# Patient Record
Sex: Male | Born: 1975 | Race: White | Hispanic: No | Marital: Single | Smoking: Former smoker
Health system: Southern US, Community
[De-identification: ages and names within clinical notes are randomized; demographics above are authoritative.]

## PROBLEM LIST (undated history)

## (undated) DIAGNOSIS — G4730 Sleep apnea, unspecified: Secondary | ICD-10-CM

## (undated) DIAGNOSIS — I4891 Unspecified atrial fibrillation: Secondary | ICD-10-CM

## (undated) DIAGNOSIS — I10 Essential (primary) hypertension: Secondary | ICD-10-CM

## (undated) DIAGNOSIS — J45909 Unspecified asthma, uncomplicated: Secondary | ICD-10-CM

## (undated) DIAGNOSIS — I509 Heart failure, unspecified: Secondary | ICD-10-CM

## (undated) HISTORY — DX: Heart failure, unspecified: I50.9

## (undated) HISTORY — PX: OTHER SURGICAL HISTORY: SHX169

## (undated) HISTORY — DX: Unspecified asthma, uncomplicated: J45.909

## (undated) HISTORY — DX: Unspecified atrial fibrillation: I48.91

## (undated) HISTORY — DX: Sleep apnea, unspecified: G47.30

## (undated) HISTORY — DX: Essential (primary) hypertension: I10

---

## 2020-09-24 DIAGNOSIS — R059 Cough, unspecified: Secondary | ICD-10-CM | POA: Diagnosis not present

## 2020-09-24 DIAGNOSIS — J0100 Acute maxillary sinusitis, unspecified: Secondary | ICD-10-CM | POA: Diagnosis not present

## 2020-09-24 DIAGNOSIS — R509 Fever, unspecified: Secondary | ICD-10-CM | POA: Diagnosis not present

## 2020-09-24 DIAGNOSIS — Z20828 Contact with and (suspected) exposure to other viral communicable diseases: Secondary | ICD-10-CM | POA: Diagnosis not present

## 2021-01-08 DIAGNOSIS — R0981 Nasal congestion: Secondary | ICD-10-CM | POA: Diagnosis not present

## 2021-01-08 DIAGNOSIS — R051 Acute cough: Secondary | ICD-10-CM | POA: Diagnosis not present

## 2021-01-08 DIAGNOSIS — Z20828 Contact with and (suspected) exposure to other viral communicable diseases: Secondary | ICD-10-CM | POA: Diagnosis not present

## 2021-01-21 DIAGNOSIS — Z7901 Long term (current) use of anticoagulants: Secondary | ICD-10-CM | POA: Diagnosis not present

## 2021-01-21 DIAGNOSIS — D151 Benign neoplasm of heart: Secondary | ICD-10-CM | POA: Diagnosis not present

## 2021-01-21 DIAGNOSIS — F1010 Alcohol abuse, uncomplicated: Secondary | ICD-10-CM | POA: Diagnosis not present

## 2021-01-21 DIAGNOSIS — I351 Nonrheumatic aortic (valve) insufficiency: Secondary | ICD-10-CM | POA: Diagnosis not present

## 2021-01-21 DIAGNOSIS — F17210 Nicotine dependence, cigarettes, uncomplicated: Secondary | ICD-10-CM | POA: Diagnosis not present

## 2021-01-21 DIAGNOSIS — Z79899 Other long term (current) drug therapy: Secondary | ICD-10-CM | POA: Diagnosis not present

## 2021-01-21 DIAGNOSIS — I110 Hypertensive heart disease with heart failure: Secondary | ICD-10-CM | POA: Diagnosis not present

## 2021-01-21 DIAGNOSIS — I4891 Unspecified atrial fibrillation: Secondary | ICD-10-CM | POA: Diagnosis not present

## 2021-01-21 DIAGNOSIS — I493 Ventricular premature depolarization: Secondary | ICD-10-CM | POA: Diagnosis not present

## 2021-01-21 DIAGNOSIS — R0602 Shortness of breath: Secondary | ICD-10-CM | POA: Diagnosis not present

## 2021-01-21 DIAGNOSIS — E669 Obesity, unspecified: Secondary | ICD-10-CM | POA: Diagnosis not present

## 2021-01-21 DIAGNOSIS — I5043 Acute on chronic combined systolic (congestive) and diastolic (congestive) heart failure: Secondary | ICD-10-CM | POA: Diagnosis not present

## 2021-01-21 DIAGNOSIS — I4819 Other persistent atrial fibrillation: Secondary | ICD-10-CM | POA: Diagnosis not present

## 2021-01-21 DIAGNOSIS — I059 Rheumatic mitral valve disease, unspecified: Secondary | ICD-10-CM | POA: Diagnosis not present

## 2021-01-21 DIAGNOSIS — I509 Heart failure, unspecified: Secondary | ICD-10-CM | POA: Diagnosis not present

## 2021-01-21 DIAGNOSIS — R7989 Other specified abnormal findings of blood chemistry: Secondary | ICD-10-CM | POA: Diagnosis not present

## 2021-01-21 DIAGNOSIS — G4730 Sleep apnea, unspecified: Secondary | ICD-10-CM | POA: Diagnosis not present

## 2021-01-21 DIAGNOSIS — R600 Localized edema: Secondary | ICD-10-CM | POA: Diagnosis not present

## 2021-01-21 DIAGNOSIS — Z6841 Body Mass Index (BMI) 40.0 and over, adult: Secondary | ICD-10-CM | POA: Diagnosis not present

## 2021-01-21 DIAGNOSIS — J189 Pneumonia, unspecified organism: Secondary | ICD-10-CM | POA: Diagnosis not present

## 2021-01-22 DIAGNOSIS — I361 Nonrheumatic tricuspid (valve) insufficiency: Secondary | ICD-10-CM | POA: Diagnosis not present

## 2021-01-22 DIAGNOSIS — I5043 Acute on chronic combined systolic (congestive) and diastolic (congestive) heart failure: Secondary | ICD-10-CM | POA: Diagnosis not present

## 2021-01-22 DIAGNOSIS — I340 Nonrheumatic mitral (valve) insufficiency: Secondary | ICD-10-CM | POA: Diagnosis not present

## 2021-01-22 DIAGNOSIS — Z6841 Body Mass Index (BMI) 40.0 and over, adult: Secondary | ICD-10-CM | POA: Diagnosis not present

## 2021-01-22 DIAGNOSIS — I4819 Other persistent atrial fibrillation: Secondary | ICD-10-CM | POA: Diagnosis not present

## 2021-01-22 DIAGNOSIS — I059 Rheumatic mitral valve disease, unspecified: Secondary | ICD-10-CM | POA: Diagnosis not present

## 2021-01-22 DIAGNOSIS — I351 Nonrheumatic aortic (valve) insufficiency: Secondary | ICD-10-CM | POA: Diagnosis not present

## 2021-01-22 DIAGNOSIS — I119 Hypertensive heart disease without heart failure: Secondary | ICD-10-CM | POA: Diagnosis not present

## 2021-01-22 DIAGNOSIS — I5042 Chronic combined systolic (congestive) and diastolic (congestive) heart failure: Secondary | ICD-10-CM | POA: Diagnosis not present

## 2021-01-23 ENCOUNTER — Telehealth: Payer: Self-pay | Admitting: Cardiology

## 2021-01-23 DIAGNOSIS — I341 Nonrheumatic mitral (valve) prolapse: Secondary | ICD-10-CM | POA: Diagnosis not present

## 2021-01-23 DIAGNOSIS — I119 Hypertensive heart disease without heart failure: Secondary | ICD-10-CM | POA: Diagnosis not present

## 2021-01-23 DIAGNOSIS — I5042 Chronic combined systolic (congestive) and diastolic (congestive) heart failure: Secondary | ICD-10-CM | POA: Diagnosis not present

## 2021-01-23 DIAGNOSIS — I4819 Other persistent atrial fibrillation: Secondary | ICD-10-CM | POA: Diagnosis not present

## 2021-01-23 DIAGNOSIS — I493 Ventricular premature depolarization: Secondary | ICD-10-CM

## 2021-01-23 DIAGNOSIS — I351 Nonrheumatic aortic (valve) insufficiency: Secondary | ICD-10-CM | POA: Diagnosis not present

## 2021-01-23 DIAGNOSIS — I059 Rheumatic mitral valve disease, unspecified: Secondary | ICD-10-CM | POA: Diagnosis not present

## 2021-01-23 DIAGNOSIS — I340 Nonrheumatic mitral (valve) insufficiency: Secondary | ICD-10-CM | POA: Diagnosis not present

## 2021-01-23 NOTE — Telephone Encounter (Signed)
His mother is not listed under his communications.  When I see him in the hospital tomorrow I will ask him if this is what he wants.  I am concerned that there is an expectation that I am going to phone and if you have please give her this message.

## 2021-01-23 NOTE — Telephone Encounter (Signed)
Patient's mother states the patient is seeing Dr. Bettina Gavia in [facility]. She states she would like to speak with Dr. Bettina Gavia personally. She states she will be at work until 2:30 pm and will go the hospital at 3:00 pm. Phone: [phone]

## 2021-01-23 NOTE — Telephone Encounter (Signed)
Left message for angie to return call  

## 2021-01-24 DIAGNOSIS — I4819 Other persistent atrial fibrillation: Secondary | ICD-10-CM | POA: Diagnosis not present

## 2021-01-24 DIAGNOSIS — I059 Rheumatic mitral valve disease, unspecified: Secondary | ICD-10-CM | POA: Diagnosis not present

## 2021-01-24 DIAGNOSIS — I119 Hypertensive heart disease without heart failure: Secondary | ICD-10-CM | POA: Diagnosis not present

## 2021-01-24 DIAGNOSIS — I5042 Chronic combined systolic (congestive) and diastolic (congestive) heart failure: Secondary | ICD-10-CM | POA: Diagnosis not present

## 2021-01-24 NOTE — Telephone Encounter (Addendum)
Informed patient mother of message below. She hopes to hear from Dr. Bettina Gavia when patient gives permission.

## 2021-01-25 DIAGNOSIS — G4733 Obstructive sleep apnea (adult) (pediatric): Secondary | ICD-10-CM | POA: Diagnosis not present

## 2021-01-25 DIAGNOSIS — I351 Nonrheumatic aortic (valve) insufficiency: Secondary | ICD-10-CM | POA: Diagnosis not present

## 2021-01-25 DIAGNOSIS — I340 Nonrheumatic mitral (valve) insufficiency: Secondary | ICD-10-CM | POA: Diagnosis not present

## 2021-01-25 DIAGNOSIS — I4891 Unspecified atrial fibrillation: Secondary | ICD-10-CM | POA: Diagnosis not present

## 2021-01-25 DIAGNOSIS — I059 Rheumatic mitral valve disease, unspecified: Secondary | ICD-10-CM | POA: Diagnosis not present

## 2021-01-25 DIAGNOSIS — I5042 Chronic combined systolic (congestive) and diastolic (congestive) heart failure: Secondary | ICD-10-CM | POA: Diagnosis not present

## 2021-01-25 DIAGNOSIS — I341 Nonrheumatic mitral (valve) prolapse: Secondary | ICD-10-CM | POA: Diagnosis not present

## 2021-01-25 DIAGNOSIS — I4819 Other persistent atrial fibrillation: Secondary | ICD-10-CM | POA: Diagnosis not present

## 2021-01-25 DIAGNOSIS — I119 Hypertensive heart disease without heart failure: Secondary | ICD-10-CM | POA: Diagnosis not present

## 2021-01-25 NOTE — Telephone Encounter (Signed)
Patient is following up. He states he is still admitted at [facility] and they are awaiting approval from Dr. Bettina Gavia prior to releasing him. Please forward to Dr. Bettina Gavia so he can determine whether patient is ready to be discharged.

## 2021-01-27 NOTE — Progress Notes (Unsigned)
Cardiology Office Note:    Date:  01/28/2021   ID:  Daniel Vance, DOB Jan 22, 1976, MRN [id number]  PCP:  Patient, No Pcp Per  Cardiologist:  Shirlee More, MD    Referring MD: No ref. provider found    ASSESSMENT:    1. Atrial myxoma   2. Persistent atrial fibrillation ([place])   3. Chronic anticoagulation   4. Hypertensive heart disease with heart failure ([place])   5. Nonrheumatic mitral valve regurgitation   6. Nonrheumatic aortic valve insufficiency   7. Sleep apnea, unspecified type    PLAN:    In order of problems listed above:  1. He will be scheduled for left heart cath Wednesday I contacted CT surgery office they said they make arrangements for him to be seen as an outpatient and I will get [facility] health to make a DVD of his TEE from Friday.  He will continue his anticoagulant repeat heart rate is 84 and will continue his beta-blocker and anticoagulant for heart rate control loop and distal diuretic and Entresto for cardiomyopathy and heart failure.  He had mild mitral and aortic regurgitation.   Next appointment: 2 weeks with me   Medication Adjustments/Labs and Tests Ordered: Current medicines are reviewed at length with the patient today.  Concerns regarding medicines are outlined above.  Orders Placed This Encounter  Procedures  . Basic metabolic panel  . CBC  . Pro b natriuretic peptide (BNP)  . EKG 12-Lead   No orders of the defined types were placed in this encounter.   Chief Complaint  Patient presents with  . Follow-up    He needs to be set up for left heart catheterization was at [facility] health discharged Friday    History of Present Illness:    Daniel Vance is a 45 y.o. male with a hx of persistent atrial fibrillation hypertensive heart disease with heart failure mildly reduced ejection fraction approximately 45% mild mitral and aortic regurgitation and an atrial mass by TEE felt to be myxoma by my partner Dr.Tobb Last seen at hospital  discharge.  He is anticoagulate it he had trouble with sleep apnea and nocturnal bradycardia in the hospital his heart failure quickly cleared blood pressure control with the introduction of Entresto diuretic MRA and a low-dose of a beta-blocker.  I reviewed the case with Dr. Darylene Price, CT surgery who advised me to go ahead and schedule him to undergo left heart catheterization and to schedule him to be seen as an outpatient feel he need resection and consideration of Maze procedure for his atrial fibrillation.  Compliance with diet, lifestyle and medications: Yes  Jayesh is seen in follow-up had spoken to CT surgery Friday recommended setting him up for left heart cath and I sent a note off to Dr. Roxy Manns office.  I requested [facility] to make a DVD copy of his transesophageal echocardiogram.  Since discharge he feels wonderful he sleeping at night he is not short of breath no edema chest pain or palpitation.  He is quite tachycardic with his atrial fibrillation coming in the door but he said he rushed to get back to to the office to see me. Past Medical History:  Diagnosis Date  . Asthma    As a child  . Atrial fibrillation ([place])   . Heart failure ([place])   . Hypertension   . Sleep apnea     Past Surgical History:  Procedure Laterality Date  . NONE      Current Medications:  Current Meds  Medication Sig  . nicotine (NICODERM CQ - DOSED IN MG/24 HOURS) 21 mg/24hr patch 21 mg.  . omeprazole (PRILOSEC) 20 MG capsule Take 20 mg by mouth daily.     Allergies:   Patient has no known allergies.   Social History   Socioeconomic History  . Marital status: Single    Spouse name: Not on file  . Number of children: Not on file  . Years of education: Not on file  . Highest education level: Not on file  Occupational History  . Not on file  Tobacco Use  . Smoking status: Former Smoker    Packs/day: 1.00    Years: 25.00    Pack years: 25.00    Types: Cigarettes    Quit date:  01/14/2021    Years since quitting: 0.0  . Smokeless tobacco: Never Used  Vaping Use  . Vaping Use: Never used  Substance and Sexual Activity  . Alcohol use: Yes    Comment: 8-10 Beers nightly  . Drug use: Not Currently    Types: Marijuana  . Sexual activity: Not on file  Other Topics Concern  . Not on file  Social History Narrative  . Not on file   Social Determinants of Health   Financial Resource Strain: Not on file  Food Insecurity: Not on file  Transportation Needs: Not on file  Physical Activity: Not on file  Stress: Not on file  Social Connections: Not on file     Family History: The patient's family history includes Heart attack in his father; Heart failure in his father; Hypertension in his father; Stomach cancer in his father. ROS:   Please see the history of present illness.    All other systems reviewed and are negative.  EKGs/Labs/Other Studies Reviewed:    The following studies were reviewed today:  EKG:  EKG ordered today and personally reviewed.  The ekg ordered today demonstrates atrial fibrillation rapid response 150 bpm nonspecific conduction delay left axis deviation poor R wave progression   Physical Exam:    VS:  BP 128/60   Pulse 86   Ht 5\' 10"  (1.778 m)   Wt 282 lb 9.6 oz (128.2 kg)   SpO2 95%   BMI 40.55 kg/m     Wt Readings from Last 3 Encounters:  01/28/21 282 lb 9.6 oz (128.2 kg)     GEN:  Well nourished, well developed in no acute distress HEENT: Normal NECK: No JVD; No carotid bruits LYMPHATICS: No lymphadenopathy CARDIAC: Irregular rapid 1 of 6 MR RESPIRATORY:  Clear to auscultation without rales, wheezing or rhonchi  ABDOMEN: Soft, non-tender, non-distended MUSCULOSKELETAL:  No edema; No deformity  SKIN: Warm and dry NEUROLOGIC:  Alert and oriented x 3 PSYCHIATRIC:  Normal affect    Signed, Shirlee More, MD  01/28/2021 11:24 AM    [place]

## 2021-01-27 NOTE — H&P (View-Only) (Signed)
Cardiology Office Note:    Date:  01/28/2021   ID:  Daniel Vance, DOB 1976/10/08, MRN [id number]  PCP:  Patient, No Pcp Per  Cardiologist:  Shirlee More, MD    Referring MD: No ref. provider found    ASSESSMENT:    1. Atrial myxoma   2. Persistent atrial fibrillation ([place])   3. Chronic anticoagulation   4. Hypertensive heart disease with heart failure ([place])   5. Nonrheumatic mitral valve regurgitation   6. Nonrheumatic aortic valve insufficiency   7. Sleep apnea, unspecified type    PLAN:    In order of problems listed above:  1. He will be scheduled for left heart cath Wednesday I contacted CT surgery office they said they make arrangements for him to be seen as an outpatient and I will get [facility] health to make a DVD of his TEE from Friday.  He will continue his anticoagulant repeat heart rate is 84 and will continue his beta-blocker and anticoagulant for heart rate control loop and distal diuretic and Entresto for cardiomyopathy and heart failure.  He had mild mitral and aortic regurgitation.   Next appointment: 2 weeks with me   Medication Adjustments/Labs and Tests Ordered: Current medicines are reviewed at length with the patient today.  Concerns regarding medicines are outlined above.  Orders Placed This Encounter  Procedures  . Basic metabolic panel  . CBC  . Pro b natriuretic peptide (BNP)  . EKG 12-Lead   No orders of the defined types were placed in this encounter.   Chief Complaint  Patient presents with  . Follow-up    He needs to be set up for left heart catheterization was at [facility] health discharged Friday    History of Present Illness:    Daniel Vance is a 45 y.o. male with a hx of persistent atrial fibrillation hypertensive heart disease with heart failure mildly reduced ejection fraction approximately 45% mild mitral and aortic regurgitation and an atrial mass by TEE felt to be myxoma by my partner Dr.Tobb Last seen at hospital  discharge.  He is anticoagulate it he had trouble with sleep apnea and nocturnal bradycardia in the hospital his heart failure quickly cleared blood pressure control with the introduction of Entresto diuretic MRA and a low-dose of a beta-blocker.  I reviewed the case with Dr. Darylene Price, CT surgery who advised me to go ahead and schedule him to undergo left heart catheterization and to schedule him to be seen as an outpatient feel he need resection and consideration of Maze procedure for his atrial fibrillation.  Compliance with diet, lifestyle and medications: Yes  Bear is seen in follow-up had spoken to CT surgery Friday recommended setting him up for left heart cath and I sent a note off to Dr. Roxy Manns office.  I requested [facility] to make a DVD copy of his transesophageal echocardiogram.  Since discharge he feels wonderful he sleeping at night he is not short of breath no edema chest pain or palpitation.  He is quite tachycardic with his atrial fibrillation coming in the door but he said he rushed to get back to to the office to see me. Past Medical History:  Diagnosis Date  . Asthma    As a child  . Atrial fibrillation ([place])   . Heart failure ([place])   . Hypertension   . Sleep apnea     Past Surgical History:  Procedure Laterality Date  . NONE      Current Medications:  Current Meds  Medication Sig  . nicotine (NICODERM CQ - DOSED IN MG/24 HOURS) 21 mg/24hr patch 21 mg.  . omeprazole (PRILOSEC) 20 MG capsule Take 20 mg by mouth daily.     Allergies:   Patient has no known allergies.   Social History   Socioeconomic History  . Marital status: Single    Spouse name: Not on file  . Number of children: Not on file  . Years of education: Not on file  . Highest education level: Not on file  Occupational History  . Not on file  Tobacco Use  . Smoking status: Former Smoker    Packs/day: 1.00    Years: 25.00    Pack years: 25.00    Types: Cigarettes    Quit date:  01/14/2021    Years since quitting: 0.0  . Smokeless tobacco: Never Used  Vaping Use  . Vaping Use: Never used  Substance and Sexual Activity  . Alcohol use: Yes    Comment: 8-10 Beers nightly  . Drug use: Not Currently    Types: Marijuana  . Sexual activity: Not on file  Other Topics Concern  . Not on file  Social History Narrative  . Not on file   Social Determinants of Health   Financial Resource Strain: Not on file  Food Insecurity: Not on file  Transportation Needs: Not on file  Physical Activity: Not on file  Stress: Not on file  Social Connections: Not on file     Family History: The patient's family history includes Heart attack in his father; Heart failure in his father; Hypertension in his father; Stomach cancer in his father. ROS:   Please see the history of present illness.    All other systems reviewed and are negative.  EKGs/Labs/Other Studies Reviewed:    The following studies were reviewed today:  EKG:  EKG ordered today and personally reviewed.  The ekg ordered today demonstrates atrial fibrillation rapid response 150 bpm nonspecific conduction delay left axis deviation poor R wave progression   Physical Exam:    VS:  BP 128/60   Pulse 86   Ht 5\' 10"  (1.778 m)   Wt 282 lb 9.6 oz (128.2 kg)   SpO2 95%   BMI 40.55 kg/m     Wt Readings from Last 3 Encounters:  01/28/21 282 lb 9.6 oz (128.2 kg)     GEN:  Well nourished, well developed in no acute distress HEENT: Normal NECK: No JVD; No carotid bruits LYMPHATICS: No lymphadenopathy CARDIAC: Irregular rapid 1 of 6 MR RESPIRATORY:  Clear to auscultation without rales, wheezing or rhonchi  ABDOMEN: Soft, non-tender, non-distended MUSCULOSKELETAL:  No edema; No deformity  SKIN: Warm and dry NEUROLOGIC:  Alert and oriented x 3 PSYCHIATRIC:  Normal affect    Signed, Shirlee More, MD  01/28/2021 11:24 AM    [place]

## 2021-01-28 ENCOUNTER — Encounter: Payer: Self-pay | Admitting: Cardiology

## 2021-01-28 ENCOUNTER — Ambulatory Visit: Payer: BC Managed Care – PPO | Admitting: Cardiology

## 2021-01-28 ENCOUNTER — Encounter: Payer: Self-pay | Admitting: Emergency Medicine

## 2021-01-28 ENCOUNTER — Other Ambulatory Visit (HOSPITAL_COMMUNITY)
Admission: RE | Admit: 2021-01-28 | Discharge: 2021-01-28 | Disposition: A | Discharge status: Home / Self Care | Payer: BC Managed Care – PPO | Source: Ambulatory Visit | Attending: Cardiology | Admitting: Cardiology

## 2021-01-28 ENCOUNTER — Other Ambulatory Visit: Payer: Self-pay

## 2021-01-28 VITALS — BP 128/60 | HR 86 | Ht 70.0 in | Wt 282.6 lb

## 2021-01-28 DIAGNOSIS — G4730 Sleep apnea, unspecified: Secondary | ICD-10-CM | POA: Diagnosis not present

## 2021-01-28 DIAGNOSIS — I509 Heart failure, unspecified: Secondary | ICD-10-CM | POA: Diagnosis not present

## 2021-01-28 DIAGNOSIS — Z87891 Personal history of nicotine dependence: Secondary | ICD-10-CM | POA: Diagnosis not present

## 2021-01-28 DIAGNOSIS — I4819 Other persistent atrial fibrillation: Secondary | ICD-10-CM | POA: Diagnosis not present

## 2021-01-28 DIAGNOSIS — Z20822 Contact with and (suspected) exposure to covid-19: Secondary | ICD-10-CM | POA: Insufficient documentation

## 2021-01-28 DIAGNOSIS — I110 Hypertensive heart disease with heart failure: Secondary | ICD-10-CM

## 2021-01-28 DIAGNOSIS — I351 Nonrheumatic aortic (valve) insufficiency: Secondary | ICD-10-CM

## 2021-01-28 DIAGNOSIS — Z7901 Long term (current) use of anticoagulants: Secondary | ICD-10-CM

## 2021-01-28 DIAGNOSIS — D151 Benign neoplasm of heart: Secondary | ICD-10-CM

## 2021-01-28 DIAGNOSIS — I080 Rheumatic disorders of both mitral and aortic valves: Secondary | ICD-10-CM | POA: Diagnosis not present

## 2021-01-28 DIAGNOSIS — I340 Nonrheumatic mitral (valve) insufficiency: Secondary | ICD-10-CM

## 2021-01-28 DIAGNOSIS — Z01812 Encounter for preprocedural laboratory examination: Secondary | ICD-10-CM | POA: Insufficient documentation

## 2021-01-28 NOTE — Patient Instructions (Signed)
Medication Instructions:  Your physician recommends that you continue on your current medications as directed. Please refer to the Current Medication list given to you today.  *If you need a refill on your cardiac medications before your next appointment, please call your pharmacy*   Lab Work: Your physician recommends that you return for lab work in: TODAY CBC, BMP If you have labs (blood work) drawn today and your tests are completely normal, you will receive your results only by: Marland Kitchen MyChart Message (if you have MyChart) OR . A paper copy in the mail If you have any lab test that is abnormal or we need to change your treatment, we will call you to review the results.   Testing/Procedures:  [place] [id number] Dept: [phone] Loc: [place]  01/28/2021  You are scheduled for a Cardiac Catheterization on Wednesday, February 16 with Dr. Glenetta Hew.  1. Please arrive at the [facility] (Main Entrance A) at [facility]: [address] [place], [place] [address] at 11:30 AM (This time is two hours before your procedure to ensure your preparation). Free valet parking service is available.   Special note: Every effort is made to have your procedure done on time. Please understand that emergencies sometimes delay scheduled procedures.  2. Diet: Do not eat solid foods after midnight.  The patient may have clear liquids until 5am upon the day of the procedure.  3. Labs: YOU HAD YOUR LABS DRAWN TODAY 01/28/2021  4. Medication instructions in preparation for your procedure:   Contrast Allergy: No  Stop taking Eliquis (Apixiban) on Tuesday, February 15.   Do not take Torsemide and Spironolactone on the day of the procedure.   On the morning of your procedure, take your Aspirin and any morning medicines NOT listed above.  You may use sips of  water.  5. Plan for one night stay--bring personal belongings. 6. Bring a current list of your medications and current insurance cards. 7. You MUST have a responsible person to drive you home. 8. Someone MUST be with you the first 24 hours after you arrive home or your discharge will be delayed. 9. Please wear clothes that are easy to get on and off and wear slip-on shoes.  Thank you for allowing [place] to care for you!   -- [place] [facility]    Follow-Up: At [facility], you and your health needs are our priority.  As part of our continuing mission to provide you with exceptional heart care, we have created designated Provider Care Teams.  These Care Teams include your primary Cardiologist (physician) and Advanced Practice Providers (APPs -  Physician Assistants and Nurse Practitioners) who all work together to provide you with the care you need, when you need it.  We recommend signing up for the patient portal called "MyChart".  Sign up information is provided on this After Visit Summary.  MyChart is used to connect with patients for Virtual Visits (Telemedicine).  Patients are able to view lab/test results, encounter notes, upcoming appointments, etc.  Non-urgent messages can be sent to your provider as well.   To learn more about what you can do with MyChart, go to [email or web address].    Your next appointment:   2 week(s)  The format for your next appointment:   In Person  Provider:   Shirlee More, MD   Other Instructions

## 2021-01-29 ENCOUNTER — Telehealth: Payer: Self-pay

## 2021-01-29 ENCOUNTER — Telehealth: Payer: Self-pay | Admitting: *Deleted

## 2021-01-29 LAB — BASIC METABOLIC PANEL
BUN/Creatinine Ratio: 12 (ref 9–20)
BUN: 14 mg/dL (ref 6–24)
CO2: 23 mmol/L (ref 20–29)
Calcium: 9.4 mg/dL (ref 8.7–10.2)
Chloride: 97 mmol/L (ref 96–106)
Creatinine, Ser: 1.2 mg/dL (ref 0.76–1.27)
GFR calc Af Amer: 85 mL/min/{1.73_m2} (ref >59)
GFR calc non Af Amer: 74 mL/min/{1.73_m2} (ref >59)
Glucose: 122 mg/dL — ABNORMAL HIGH (ref 65–99)
Potassium: 4.6 mmol/L (ref 3.5–5.2)
Sodium: 137 mmol/L (ref 134–144)

## 2021-01-29 LAB — CBC
Hematocrit: 54.7 % — ABNORMAL HIGH (ref 37.5–51.0)
Hemoglobin: 18.4 g/dL — ABNORMAL HIGH (ref 13.0–17.7)
MCH: 30.2 pg (ref 26.6–33.0)
MCHC: 33.6 g/dL (ref 31.5–35.7)
MCV: 90 fL (ref 79–97)
Platelets: 265 10*3/uL (ref 150–450)
RBC: 6.09 x10E6/uL — ABNORMAL HIGH (ref 4.14–5.80)
RDW: 11.4 % — ABNORMAL LOW (ref 11.6–15.4)
WBC: 9.4 10*3/uL (ref 3.4–10.8)

## 2021-01-29 LAB — SARS CORONAVIRUS 2 (TAT 6-24 HRS): SARS Coronavirus 2: NEGATIVE

## 2021-01-29 LAB — PRO B NATRIURETIC PEPTIDE: NT-Pro BNP: 765 pg/mL — ABNORMAL HIGH (ref 0–86)

## 2021-01-29 NOTE — Telephone Encounter (Signed)
-----   Message from Richardo Priest, MD sent at 01/29/2021 10:14 AM EST ----- Good result for coronary angiography  Mind him to hand carry the DVT of his TEE so the staff can directed to Dr. Darylene Price cardiothoracic surgery.

## 2021-01-29 NOTE — Telephone Encounter (Signed)
Left message on patients voicemail to please return our call.   

## 2021-01-29 NOTE — Telephone Encounter (Signed)
Pt contacted pre-catheterization scheduled at [facility] for: Wednesday January 30, 2021 1:30 PM Verified arrival time and place: [facility]) at: 11:30 AM   No solid food after midnight prior to cath, clear liquids until 5 AM day of procedure.  Hold: Eliquis-per pt- last dose PM 01/28/21 knows to hold until post procedure Spironolactone-AM of procedure Torsemide/KCl-AM of procedure  Except hold medications AM meds can be  taken pre-cath with sips of water including: ASA 81 mg   Confirmed patient has responsible adult to drive home post procedure and be with patient first 24 hours after arriving home: yes  You are allowed ONE visitor in the waiting room during the time you are at the hospital for your procedure. Both you and your visitor must wear a mask once you enter the hospital.   Reviewed procedure/mask/visitor instructions with patient.

## 2021-01-29 NOTE — Telephone Encounter (Signed)
Spoke with patient regarding results and recommendation.  Patient verbalizes understanding and is agreeable to plan of care. Advised patient to call back with any issues or concerns.  

## 2021-01-30 ENCOUNTER — Ambulatory Visit (HOSPITAL_COMMUNITY)
Admission: RE | Admit: 2021-01-30 | Discharge: 2021-01-30 | Disposition: A | Discharge status: Home / Self Care | Payer: BC Managed Care – PPO | Attending: Cardiology | Admitting: Cardiology

## 2021-01-30 ENCOUNTER — Encounter (HOSPITAL_COMMUNITY)
Admission: RE | Disposition: A | Discharge status: Home / Self Care | Payer: Self-pay | Source: Home / Self Care | Attending: Cardiology

## 2021-01-30 DIAGNOSIS — I110 Hypertensive heart disease with heart failure: Secondary | ICD-10-CM | POA: Diagnosis not present

## 2021-01-30 DIAGNOSIS — I480 Paroxysmal atrial fibrillation: Secondary | ICD-10-CM | POA: Diagnosis not present

## 2021-01-30 DIAGNOSIS — I4819 Other persistent atrial fibrillation: Secondary | ICD-10-CM | POA: Diagnosis not present

## 2021-01-30 DIAGNOSIS — Z7901 Long term (current) use of anticoagulants: Secondary | ICD-10-CM | POA: Diagnosis not present

## 2021-01-30 DIAGNOSIS — G4730 Sleep apnea, unspecified: Secondary | ICD-10-CM | POA: Insufficient documentation

## 2021-01-30 DIAGNOSIS — Z87891 Personal history of nicotine dependence: Secondary | ICD-10-CM | POA: Insufficient documentation

## 2021-01-30 DIAGNOSIS — I080 Rheumatic disorders of both mitral and aortic valves: Secondary | ICD-10-CM | POA: Diagnosis not present

## 2021-01-30 DIAGNOSIS — I5189 Other ill-defined heart diseases: Secondary | ICD-10-CM

## 2021-01-30 DIAGNOSIS — Z20822 Contact with and (suspected) exposure to covid-19: Secondary | ICD-10-CM | POA: Diagnosis not present

## 2021-01-30 DIAGNOSIS — D151 Benign neoplasm of heart: Secondary | ICD-10-CM | POA: Diagnosis not present

## 2021-01-30 DIAGNOSIS — I509 Heart failure, unspecified: Secondary | ICD-10-CM | POA: Diagnosis not present

## 2021-01-30 HISTORY — DX: Other ill-defined heart diseases: I51.89

## 2021-01-30 HISTORY — PX: LEFT HEART CATH AND CORONARY ANGIOGRAPHY: CATH118249

## 2021-01-30 SURGERY — LEFT HEART CATH AND CORONARY ANGIOGRAPHY
Anesthesia: LOCAL

## 2021-01-30 MED ORDER — METOPROLOL TARTRATE 25 MG PO TABS
25.0000 mg | ORAL_TABLET | Freq: Once | ORAL | Status: AC
  Administered 2021-01-30: 25 mg via ORAL
  Filled 2021-01-30: qty 1
  Filled 2021-01-30: qty 2

## 2021-01-30 MED ORDER — SODIUM CHLORIDE 0.9% FLUSH
3.0000 mL | INTRAVENOUS | Status: DC | PRN
Start: 2021-01-30 — End: 2021-01-30

## 2021-01-30 MED ORDER — SODIUM CHLORIDE 0.9% FLUSH: 3.0000 mL | Freq: Two times a day (BID) | INTRAVENOUS | Status: DC

## 2021-01-30 MED ORDER — HEPARIN SODIUM (PORCINE) 1000 UNIT/ML IJ SOLN
INTRAMUSCULAR | Status: DC | PRN
  Administered 2021-01-30: 6500 [IU] via INTRAVENOUS

## 2021-01-30 MED ORDER — HEPARIN (PORCINE) IN NACL 1000-0.9 UT/500ML-% IV SOLN
INTRAVENOUS | Status: DC | PRN
  Administered 2021-01-30 (×3): 500 mL

## 2021-01-30 MED ORDER — SODIUM CHLORIDE 0.9 % IV SOLN: INTRAVENOUS | Status: AC

## 2021-01-30 MED ORDER — MIDAZOLAM HCL 2 MG/2ML IJ SOLN
INTRAMUSCULAR | Status: DC | PRN
  Administered 2021-01-30: 1 mg via INTRAVENOUS
  Administered 2021-01-30: 2 mg via INTRAVENOUS

## 2021-01-30 MED ORDER — SODIUM CHLORIDE 0.9 % IV SOLN: 250.0000 mL | INTRAVENOUS | Status: DC | PRN

## 2021-01-30 MED ORDER — HYDRALAZINE HCL 20 MG/ML IJ SOLN: 10.0000 mg | INTRAMUSCULAR | Status: DC | PRN

## 2021-01-30 MED ORDER — SODIUM CHLORIDE 0.9 % WEIGHT BASED INFUSION: 1.0000 mL/kg/h | INTRAVENOUS | Status: DC

## 2021-01-30 MED ORDER — FENTANYL CITRATE (PF) 100 MCG/2ML IJ SOLN
INTRAMUSCULAR | Status: DC | PRN
  Administered 2021-01-30: 50 ug via INTRAVENOUS

## 2021-01-30 MED ORDER — ACETAMINOPHEN 325 MG PO TABS: 650.0000 mg | ORAL_TABLET | ORAL | Status: DC | PRN

## 2021-01-30 MED ORDER — LABETALOL HCL 5 MG/ML IV SOLN: 10.0000 mg | INTRAVENOUS | Status: DC | PRN

## 2021-01-30 MED ORDER — MIDAZOLAM HCL 2 MG/2ML IJ SOLN
INTRAMUSCULAR | Status: AC
  Filled 2021-01-30: qty 2

## 2021-01-30 MED ORDER — FENTANYL CITRATE (PF) 100 MCG/2ML IJ SOLN
INTRAMUSCULAR | Status: AC
  Filled 2021-01-30: qty 2

## 2021-01-30 MED ORDER — HEPARIN (PORCINE) IN NACL 1000-0.9 UT/500ML-% IV SOLN
INTRAVENOUS | Status: AC
  Filled 2021-01-30: qty 500

## 2021-01-30 MED ORDER — LIDOCAINE HCL (PF) 1 % IJ SOLN
INTRAMUSCULAR | Status: AC
  Filled 2021-01-30: qty 30

## 2021-01-30 MED ORDER — METOPROLOL TARTRATE 5 MG/5ML IV SOLN
INTRAVENOUS | Status: DC | PRN
Start: 2021-01-30 — End: 2021-01-30
  Administered 2021-01-30: 5 mg via INTRAVENOUS

## 2021-01-30 MED ORDER — ONDANSETRON HCL 4 MG/2ML IJ SOLN: 4.0000 mg | Freq: Four times a day (QID) | INTRAMUSCULAR | Status: DC | PRN

## 2021-01-30 MED ORDER — SODIUM CHLORIDE 0.9% FLUSH: 3.0000 mL | INTRAVENOUS | Status: DC | PRN

## 2021-01-30 MED ORDER — VERAPAMIL HCL 2.5 MG/ML IV SOLN
INTRAVENOUS | Status: DC | PRN
  Administered 2021-01-30: 10 mL via INTRA_ARTERIAL

## 2021-01-30 MED ORDER — ASPIRIN 81 MG PO CHEW
81.0000 mg | CHEWABLE_TABLET | ORAL | Status: AC
  Administered 2021-01-30: 81 mg via ORAL
  Filled 2021-01-30: qty 1

## 2021-01-30 MED ORDER — LIDOCAINE HCL (PF) 1 % IJ SOLN
INTRAMUSCULAR | Status: DC | PRN
  Administered 2021-01-30: 2 mL

## 2021-01-30 MED ORDER — METOPROLOL TARTRATE 5 MG/5ML IV SOLN
INTRAVENOUS | Status: AC
  Filled 2021-01-30: qty 5

## 2021-01-30 MED ORDER — HEPARIN SODIUM (PORCINE) 1000 UNIT/ML IJ SOLN
INTRAMUSCULAR | Status: AC
  Filled 2021-01-30: qty 1

## 2021-01-30 MED ORDER — SODIUM CHLORIDE 0.9 % WEIGHT BASED INFUSION
3.0000 mL/kg/h | INTRAVENOUS | Status: AC
  Administered 2021-01-30: 3 mL/kg/h via INTRAVENOUS

## 2021-01-30 MED ORDER — IOHEXOL 350 MG/ML SOLN
INTRAVENOUS | Status: DC | PRN
  Administered 2021-01-30: 60 mL

## 2021-01-30 MED ORDER — VERAPAMIL HCL 2.5 MG/ML IV SOLN
INTRAVENOUS | Status: AC
  Filled 2021-01-30: qty 2

## 2021-01-30 MED ORDER — HEPARIN (PORCINE) IN NACL 1000-0.9 UT/500ML-% IV SOLN
INTRAVENOUS | Status: AC
  Filled 2021-01-30: qty 1000

## 2021-01-30 SURGICAL SUPPLY — 13 items
BAG SNAP BAND KOVER 36X36 (MISCELLANEOUS) ×2 IMPLANT
CATH INFINITI JR4 5F (CATHETERS) ×2 IMPLANT
CATH OPTITORQUE TIG 4.0 5F (CATHETERS) ×2 IMPLANT
COVER DOME SNAP 22 D (MISCELLANEOUS) ×2 IMPLANT
DEVICE RAD COMP TR BAND LRG (VASCULAR PRODUCTS) ×2 IMPLANT
GLIDESHEATH SLEND SS 6F .021 (SHEATH) ×2 IMPLANT
GUIDEWIRE INQWIRE 1.5J.035X260 (WIRE) ×1 IMPLANT
INQWIRE 1.5J .035X260CM (WIRE) ×2
KIT HEART LEFT (KITS) ×2 IMPLANT
PACK CARDIAC CATHETERIZATION (CUSTOM PROCEDURE TRAY) ×2 IMPLANT
SHEATH PROBE COVER 6X72 (BAG) ×2 IMPLANT
TRANSDUCER W/STOPCOCK (MISCELLANEOUS) ×2 IMPLANT
TUBING CIL FLEX 10 FLL-RA (TUBING) ×2 IMPLANT

## 2021-01-30 NOTE — Discharge Instructions (Signed)
Radial Site Care  Keep right wrist elevated 24 hours Restart eliquis tonight 10pm  This sheet gives you information about how to care for yourself after your procedure. Your health care provider may also give you more specific instructions. If you have problems or questions, contact your health care provider. What can I expect after the procedure? After the procedure, it is common to have:  Bruising and tenderness at the catheter insertion area. Follow these instructions at home: Medicines  Take over-the-counter and prescription medicines only as told by your health care provider. Insertion site care  Follow instructions from your health care provider about how to take care of your insertion site. Make sure you: ? Wash your hands with soap and water before you change your bandage (dressing). If soap and water are not available, use hand sanitizer. ? Change your dressing as told by your health care provider. ? Leave stitches (sutures), skin glue, or adhesive strips in place. These skin closures may need to stay in place for 2 weeks or longer. If adhesive strip edges start to loosen and curl up, you may trim the loose edges. Do not remove adhesive strips completely unless your health care provider tells you to do that.  Check your insertion site every day for signs of infection. Check for: ? Redness, swelling, or pain. ? Fluid or blood. ? Pus or a bad smell. ? Warmth.  Do not take baths, swim, or use a hot tub until your health care provider approves.  You may shower 24-48 hours after the procedure, or as directed by your health care provider. ? Remove the dressing and gently wash the site with plain soap and water. ? Pat the area dry with a clean towel. ? Do not rub the site. That could cause bleeding.  Do not apply powder or lotion to the site. Activity  For 24 hours after the procedure, or as directed by your health care provider: ? Do not flex or bend the affected arm. ? Do  not push or pull heavy objects with the affected arm. ? Do not drive yourself home from the hospital or clinic. You may drive 24 hours after the procedure unless your health care provider tells you not to. ? Do not operate machinery or power tools.  Do not lift anything that is heavier than 10 lb (4.5 kg), or the limit that you are told, until your health care provider says that it is safe.  Ask your health care provider when it is okay to: ? Return to work or school. ? Resume usual physical activities or sports. ? Resume sexual activity.   General instructions  If the catheter site starts to bleed, raise your arm and put firm pressure on the site. If the bleeding does not stop, get help right away. This is a medical emergency.  If you went home on the same day as your procedure, a responsible adult should be with you for the first 24 hours after you arrive home.  Keep all follow-up visits as told by your health care provider. This is important. Contact a health care provider if:  You have a fever.  You have redness, swelling, or yellow drainage around your insertion site. Get help right away if:  You have unusual pain at the radial site.  The catheter insertion area swells very fast.  The insertion area is bleeding, and the bleeding does not stop when you hold steady pressure on the area.  Your arm or hand becomes  pale, cool, tingly, or numb. These symptoms may represent a serious problem that is an emergency. Do not wait to see if the symptoms will go away. Get medical help right away. Call your local emergency services (911 in the [place] Do not drive yourself to the hospital. Summary  After the procedure, it is common to have bruising and tenderness at the site.  Follow instructions from your health care provider about how to take care of your radial site wound. Check the wound every day for signs of infection.  Do not lift anything that is heavier than 10 lb (4.5 kg), or the  limit that you are told, until your health care provider says that it is safe. This information is not intended to replace advice given to you by your health care provider. Make sure you discuss any questions you have with your health care provider. Document Revised: 01/06/2018 Document Reviewed: 01/06/2018 Elsevier Patient Education  2021 [employer].

## 2021-01-30 NOTE — Interval H&P Note (Signed)
History and Physical Interval Note:  01/30/2021 3:29 PM  Daniel Vance  has presented today for surgery, with the diagnosis of heart failure - atrial mass.  The various methods of treatment have been discussed with the patient and family. After consideration of risks, benefits and other options for treatment, the patient has consented to  Procedure(s): LEFT HEART CATH AND CORONARY ANGIOGRAPHY (N/A) as a surgical intervention.  The patient's history has been reviewed, patient examined, no change in status, stable for surgery.  I have reviewed the patient's chart and labs.  Questions were answered to the patient's satisfaction.     Glenetta Hew

## 2021-01-30 NOTE — Progress Notes (Signed)
Pt reports left AC swelling from IV at another hospital prior to visit today, heating pad wrapped in towel placed at site

## 2021-01-31 ENCOUNTER — Encounter (HOSPITAL_COMMUNITY): Payer: Self-pay | Admitting: Cardiology

## 2021-02-01 DIAGNOSIS — I4819 Other persistent atrial fibrillation: Secondary | ICD-10-CM | POA: Insufficient documentation

## 2021-02-01 DIAGNOSIS — I509 Heart failure, unspecified: Secondary | ICD-10-CM | POA: Insufficient documentation

## 2021-02-01 DIAGNOSIS — I4891 Unspecified atrial fibrillation: Secondary | ICD-10-CM | POA: Insufficient documentation

## 2021-02-01 DIAGNOSIS — J45909 Unspecified asthma, uncomplicated: Secondary | ICD-10-CM | POA: Insufficient documentation

## 2021-02-01 DIAGNOSIS — G4730 Sleep apnea, unspecified: Secondary | ICD-10-CM | POA: Insufficient documentation

## 2021-02-01 DIAGNOSIS — I10 Essential (primary) hypertension: Secondary | ICD-10-CM | POA: Insufficient documentation

## 2021-02-12 ENCOUNTER — Ambulatory Visit: Payer: BC Managed Care – PPO | Admitting: Cardiology

## 2021-02-12 ENCOUNTER — Encounter: Payer: Self-pay | Admitting: Cardiology

## 2021-02-12 ENCOUNTER — Other Ambulatory Visit: Payer: Self-pay

## 2021-02-12 VITALS — BP 124/60 | HR 84 | Ht 70.0 in | Wt 280.0 lb

## 2021-02-12 DIAGNOSIS — I493 Ventricular premature depolarization: Secondary | ICD-10-CM | POA: Insufficient documentation

## 2021-02-12 DIAGNOSIS — I5042 Chronic combined systolic (congestive) and diastolic (congestive) heart failure: Secondary | ICD-10-CM | POA: Insufficient documentation

## 2021-02-12 DIAGNOSIS — D151 Benign neoplasm of heart: Secondary | ICD-10-CM

## 2021-02-12 DIAGNOSIS — Z7901 Long term (current) use of anticoagulants: Secondary | ICD-10-CM

## 2021-02-12 DIAGNOSIS — I110 Hypertensive heart disease with heart failure: Secondary | ICD-10-CM | POA: Diagnosis not present

## 2021-02-12 DIAGNOSIS — I059 Rheumatic mitral valve disease, unspecified: Secondary | ICD-10-CM | POA: Insufficient documentation

## 2021-02-12 DIAGNOSIS — I119 Hypertensive heart disease without heart failure: Secondary | ICD-10-CM | POA: Insufficient documentation

## 2021-02-12 DIAGNOSIS — I351 Nonrheumatic aortic (valve) insufficiency: Secondary | ICD-10-CM | POA: Insufficient documentation

## 2021-02-12 DIAGNOSIS — I3481 Nonrheumatic mitral (valve) annulus calcification: Secondary | ICD-10-CM | POA: Insufficient documentation

## 2021-02-12 DIAGNOSIS — I4819 Other persistent atrial fibrillation: Secondary | ICD-10-CM

## 2021-02-12 DIAGNOSIS — I340 Nonrheumatic mitral (valve) insufficiency: Secondary | ICD-10-CM | POA: Insufficient documentation

## 2021-02-12 DIAGNOSIS — I509 Heart failure, unspecified: Secondary | ICD-10-CM | POA: Insufficient documentation

## 2021-02-12 DIAGNOSIS — I4891 Unspecified atrial fibrillation: Secondary | ICD-10-CM | POA: Insufficient documentation

## 2021-02-12 NOTE — Progress Notes (Signed)
Cardiology Office Note:    Date:  02/12/2021   ID:  Daniel Vance, DOB [birth year], MRN [id number]  PCP:  Patient, No Pcp Per  Cardiologist:  Shirlee More, MD    Referring MD: No ref. provider found    ASSESSMENT:    1. Atrial myxoma   2. Persistent atrial fibrillation ([place])   3. Chronic anticoagulation   4. Hypertensive heart disease with heart failure ([place])    PLAN:    In order of problems listed above:  1. He is improved pending CT surgery evaluation.  When he had coronary angiography handcarried the disc of the TEE I told him to call the surgeon's office to tell them that it should have been handcarried to to the office 2. Stable rate is better controlled continue his beta-blocker anticoagulant 3. Improved he has no fluid overload continue his current diuretic MRA and Entresto.   Next appointment: I will see him following CT surgery care   Medication Adjustments/Labs and Tests Ordered: Current medicines are reviewed at length with the patient today.  Concerns regarding medicines are outlined above.  No orders of the defined types were placed in this encounter.  No orders of the defined types were placed in this encounter.   Chief Complaint  Patient presents with  . Follow-up  . Atrial Fibrillation  . Congestive Heart Failure    History of Present Illness:    Daniel Vance is a 45 y.o. male with a hx of persistent atrial fibrillation hypertensive heart disease with heart failure EF is mildly reduced in the range of 45% with mild mitral and aortic regurgitation and atrial mass by TEE felt to be myxoma by my partner Dr. Harriet Masson last seen 01/28/2021 prior to undergoing left heart catheterization.  He is pending evaluation as an outpatient Dr. Roxy Manns CT surgery regarding atrial mass resection and the potential for surgical maze procedure for his atrial fibrillation.  Atrial fibrillation is difficult to control with rate in hospital with more intense rate suppression  he had significant bradycardia nocturnally and concerns for obstructive sleep apnea.  Left heart catheterization showed normal coronary arteries rapid atrial fibrillation elevated left ventricular diastolic pressure and was given additional oral beta-blocker for rate control.  Scheduled to be seen by CT surgery service at 02/18/2021  Compliance with diet, lifestyle and medications: Yes  He has returned to work strength and endurance are improved he is not short of breath but he feels a little lightheaded at times.  He has no orthopnea edema chest pain or syncope at times he is aware of his heart beating not severe sustained.  In contrast to his previous visit his rate is now well controlled.  He tolerates his anticoagulant without bleeding. His mother is present we discussed the findings of his transesophageal echocardiogram and the need for CT surgery evaluation both for the atrial myxoma as well as consideration of surgical maze to resume sinus rhythm. Past Medical History:  Diagnosis Date  . Asthma    As a child  . Atrial fibrillation ([place])   . Heart failure ([place])   . Hypertension   . Sleep apnea     Past Surgical History:  Procedure Laterality Date  . LEFT HEART CATH AND CORONARY ANGIOGRAPHY N/A 01/30/2021   Procedure: LEFT HEART CATH AND CORONARY ANGIOGRAPHY;  Surgeon: Leonie Man, MD;  Location: [facility];  Service: Cardiovascular;  Laterality: N/A;  . NONE      Current Medications: Current Meds  Medication  Sig  . ELIQUIS 5 MG TABS tablet Take 5 mg by mouth 2 (two) times daily.  Marland Kitchen ENTRESTO 24-26 MG Take 1 tablet by mouth 2 (two) times daily.  . metoprolol tartrate (LOPRESSOR) 25 MG tablet Take 12.5 mg by mouth 2 (two) times daily.  Marland Kitchen omeprazole (PRILOSEC) 20 MG capsule Take 20 mg by mouth daily.  . Potassium Chloride ER 20 MEQ TBCR Take 20 mEq by mouth 2 (two) times daily.  Marland Kitchen spironolactone (ALDACTONE) 25 MG tablet Take 25 mg by mouth daily.  Marland Kitchen torsemide (DEMADEX)  20 MG tablet Take 20 mg by mouth daily.     Allergies:   Patient has no known allergies.   Social History   Socioeconomic History  . Marital status: Single    Spouse name: Not on file  . Number of children: Not on file  . Years of education: Not on file  . Highest education level: Not on file  Occupational History  . Not on file  Tobacco Use  . Smoking status: Former Smoker    Packs/day: 1.00    Years: 25.00    Pack years: 25.00    Types: Cigarettes    Quit date: 01/14/2021    Years since quitting: 0.0  . Smokeless tobacco: Never Used  Vaping Use  . Vaping Use: Never used  Substance and Sexual Activity  . Alcohol use: Yes    Comment: 8-10 Beers nightly  . Drug use: Not Currently    Types: Marijuana  . Sexual activity: Not on file  Other Topics Concern  . Not on file  Social History Narrative  . Not on file   Social Determinants of Health   Financial Resource Strain: Not on file  Food Insecurity: Not on file  Transportation Needs: Not on file  Physical Activity: Not on file  Stress: Not on file  Social Connections: Not on file     Family History: The patient's family history includes Heart attack in his father; Heart failure in his father; Hypertension in his father; Stomach cancer in his father. ROS:   Please see the history of present illness.    All other systems reviewed and are negative.  EKGs/Labs/Other Studies Reviewed:    The following studies were reviewed today:    Recent Labs: 01/28/2021: BUN 14; Creatinine, Ser 1.20; Hemoglobin 18.4; NT-Pro BNP 765; Platelets 265; Potassium 4.6; Sodium 137  Recent Lipid Panel No results found for: CHOL, TRIG, HDL, CHOLHDL, VLDL, LDLCALC, LDLDIRECT  Physical Exam:    VS:  BP 124/60   Pulse 84   Ht 5\' 10"  (1.778 m)   Wt 280 lb (127 kg)   SpO2 97%   BMI 40.18 kg/m     Wt Readings from Last 3 Encounters:  02/12/21 280 lb (127 kg)  01/30/21 280 lb (127 kg)  01/28/21 282 lb 9.6 oz (128.2 kg)     GEN:  He looks much healthier he is not short of breath he has lost a significant amount of weight well nourished, well developed in no acute distress HEENT: Normal NECK: No JVD; No carotid bruits LYMPHATICS: No lymphadenopathy CARDIAC: Irregular S1 variable no murmur  RESPIRATORY:  Clear to auscultation without rales, wheezing or rhonchi  ABDOMEN: Soft, non-tender, non-distended MUSCULOSKELETAL:  No edema; No deformity  SKIN: Warm and dry NEUROLOGIC:  Alert and oriented x 3 PSYCHIATRIC:  Normal affect    Signed, Shirlee More, MD  02/12/2021 11:27 AM    [facility]

## 2021-02-12 NOTE — Patient Instructions (Signed)

## 2021-02-18 ENCOUNTER — Telehealth: Payer: Self-pay | Admitting: Cardiology

## 2021-02-18 ENCOUNTER — Encounter: Payer: Self-pay | Admitting: Thoracic Surgery (Cardiothoracic Vascular Surgery)

## 2021-02-18 ENCOUNTER — Institutional Professional Consult (permissible substitution): Payer: BC Managed Care – PPO | Admitting: Thoracic Surgery (Cardiothoracic Vascular Surgery)

## 2021-02-18 ENCOUNTER — Telehealth: Payer: Self-pay | Admitting: *Deleted

## 2021-02-18 ENCOUNTER — Other Ambulatory Visit: Payer: Self-pay | Admitting: Thoracic Surgery (Cardiothoracic Vascular Surgery)

## 2021-02-18 ENCOUNTER — Other Ambulatory Visit: Payer: Self-pay

## 2021-02-18 ENCOUNTER — Other Ambulatory Visit: Payer: Self-pay | Admitting: *Deleted

## 2021-02-18 VITALS — BP 110/72 | HR 113 | Temp 97.7°F | Resp 20 | Ht 71.0 in | Wt 283.8 lb

## 2021-02-18 DIAGNOSIS — I4819 Other persistent atrial fibrillation: Secondary | ICD-10-CM | POA: Diagnosis not present

## 2021-02-18 DIAGNOSIS — I5189 Other ill-defined heart diseases: Secondary | ICD-10-CM

## 2021-02-18 DIAGNOSIS — I480 Paroxysmal atrial fibrillation: Secondary | ICD-10-CM

## 2021-02-18 DIAGNOSIS — I340 Nonrheumatic mitral (valve) insufficiency: Secondary | ICD-10-CM | POA: Diagnosis not present

## 2021-02-18 MED ORDER — METOPROLOL TARTRATE 25 MG PO TABS: 12.5000 mg | ORAL_TABLET | Freq: Two times a day (BID) | ORAL | 3 refills | Status: AC

## 2021-02-18 MED ORDER — POTASSIUM CHLORIDE ER 20 MEQ PO TBCR: 20.0000 meq | EXTENDED_RELEASE_TABLET | Freq: Two times a day (BID) | ORAL | 3 refills | Status: AC

## 2021-02-18 MED ORDER — ENTRESTO 24-26 MG PO TABS: 1.0000 | ORAL_TABLET | Freq: Two times a day (BID) | ORAL | 3 refills | Status: AC

## 2021-02-18 MED ORDER — SPIRONOLACTONE 25 MG PO TABS: 25.0000 mg | ORAL_TABLET | Freq: Every day | ORAL | 3 refills | Status: AC

## 2021-02-18 MED ORDER — TORSEMIDE 20 MG PO TABS: 20.0000 mg | ORAL_TABLET | Freq: Every day | ORAL | 3 refills | Status: AC

## 2021-02-18 MED ORDER — ELIQUIS 5 MG PO TABS: 5.0000 mg | ORAL_TABLET | Freq: Two times a day (BID) | ORAL | 3 refills | Status: AC

## 2021-02-18 NOTE — Telephone Encounter (Signed)
Spoke to the patients  Mother just now and let her know that I sent in all of his refills for him at this time. Upon looking into this magnesium it looks like the patient was advised to stop this medication after discharge and the patient is now aware of this.    Encouraged patient to call back with any questions or concerns.

## 2021-02-18 NOTE — Telephone Encounter (Signed)
Refills sent in per request 

## 2021-02-18 NOTE — Patient Instructions (Addendum)
Stop smoking immediately and permanently.  Stop taking Eliquis after you take your dose on Thursday March 17th  Continue taking all other medications without change through the day before surgery.  Make sure to bring all of your medications with you when you come for your Pre-Admission Testing appointment at [facility] Short-Stay Department.  Have nothing to eat or drink after midnight the night before surgery.  On the morning of surgery do not take any medications  At your appointment for Pre-Admission Testing at the [facility] Short-Stay Department you will be asked to sign permission forms for your upcoming surgery.  By definition your signature on these forms implies that you and/or your designee provide full informed consent for your planned surgical procedure(s), that alternative treatment options have been discussed, that you understand and accept any and all potential risks, and that you have some understanding of what to expect for your post-operative convalescence.  For elective mitral valve repair or replacement with or without maze procedure potential operative risks include but are not limited to at least some risk of death, stroke or other neurologic complication, myocardial infarction, congestive heart failure, respiratory failure, renal failure, bleeding requiring transfusion and/or reexploration, arrhythmia, heart block or bradycardia requiring permanent pacemaker insertion, infection or other wound complications, pneumonia, pleural and/or pericardial effusion, pulmonary embolus, aortic dissection or other major vascular complication, or other immediate or delayed complications related to valve repair or replacement including but not limited to recurrent or persistent mitral regurgitation and/or mitral stenosis, LV outflow tract obstruction, aortic insufficiency, paravalvular leak, posterior AV groove disruption, late structural valve deterioration  and failure, thrombosis, embolization, or endocarditis.  Specific risks potentially related to the minimally-invasive approach include but are not limited to risk of conversion to full or partial sternotomy, aortic dissection or other major vascular complication, unilateral acute lung injury or pulmonary edema, phrenic nerve dysfunction or paralysis, rib fracture, chronic pain, lung hernia, or lymphocele.  Please call to schedule a follow-up appointment in our office prior to surgery if you have any unresolved questions about your planned surgical procedure, the associated risks, alternative treatment options, and/or expectations for your post-operative recovery.

## 2021-02-18 NOTE — Telephone Encounter (Signed)
Left VM as a reminder for patient to bring CD of TEE to appt today with Dr. Roxy Manns.

## 2021-02-18 NOTE — Progress Notes (Signed)
[address]       [place] [address]             [phone]     CARDIOTHORACIC SURGERY CONSULTATION REPORT  Referring Provider is Richardo Priest, MD PCP is Patient, No Pcp Per  Chief Complaint  Patient presents with  . 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  Patient states that he has been obese for the majority of his adult life and he has longstanding history of tobacco use.  However, he has not had any significant medical problems until recently.  Last October he 1st began to experience symptoms of exertional shortness of breath.  He was evaluated at urgent care center and treated for possible bronchitis.  Symptoms reportedly got better.  Beginning in January he started to experience worsening shortness of breath and lower extremity edema.  Symptoms progressed until he ultimately was seen in urgent care where he was noted to be in rapid atrial fibrillation.  He was sent to [facility] where he was an inpatient for a total of 5 days.  Details of that hospitalization are not currently available but the patient was found to have hypertensive heart disease with mildly reduced ejection fraction estimated 45% with mild mitral regurgitation and mild aortic insufficiency.  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  He was also told that he had obstructive sleep apnea with nocturnal bradycardia.  Heart failure symptoms improved on Entresto and oral diuretics with a low-dose  beta-blocker.  Since hospital discharge the patient was seen in follow-up by Dr. Bettina Gavia on 01/28/2021.  Since then he underwent diagnostic cardiac catheterization 01/30/2021 by Dr. Ellyn Hack which revealed angiographically normal coronary arteries with severely elevated left ventricular end-diastolic pressure.  At the time of the catheterization the patient was in atrial fibrillation with rapid ventricular response.  Cardiothoracic surgical consultation was requested.  Patient is single and lives with his mother in [place], [place].  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  This work does occasionally require strenuous exertion.  The patient does not exercise on a regular basis.  He has a long history of tobacco abuse smoking more than 1 pack of cigarettes per day for many years.  Since hospital discharge he has tried to cut back and is currently smoking less than 1/2 pack of cigarettes daily.  He states that prior to last fall he had not experienced any significant symptoms of exertional shortness of breath although he admits that he does not exercise at all on a regular basis.  Since hospital discharge he has been doing fairly well and he states that he only gets short of breath with more strenuous exertion.  Swelling in his legs has resolved and he states that he has lost a total of 30 pounds since he was placed on a diuretic.  Appetite is good.  He has no fevers, chills, nor arthralgias.  He has not had any tachypalpitations or dizzy spells.  Past Medical History:  Diagnosis Date  . Asthma    As a child  . Atrial fibrillation ([place])   . Heart failure ([place])   . Hypertension   . 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 Sleep apnea     Past Surgical History:  Procedure Laterality Date  . LEFT HEART CATH AND CORONARY ANGIOGRAPHY N/A 01/30/2021   Procedure: LEFT HEART CATH AND CORONARY ANGIOGRAPHY;  Surgeon: Leonie Man, MD;  Location: [facility];  Service: Cardiovascular;  Laterality: N/A;  . NONE      Family History  Problem Relation Age of Onset  . Stomach cancer Father   . Hypertension Father   . Heart attack Father   . Heart failure Father     Social History   Socioeconomic History  . Marital status: Single    Spouse name: Not on file  . Number of children: Not on file  . Years of education: Not on file  . Highest education level: Not on file  Occupational History  . Not on file  Tobacco Use  . Smoking status: Former Smoker    Packs/day: 1.00    Years: 25.00    Pack years: 25.00    Types: Cigarettes    Quit date: 01/14/2021    Years since quitting: 0.0  . Smokeless tobacco: Never Used  Vaping Use  . Vaping Use: Never used  Substance and Sexual Activity  . Alcohol use: Yes    Comment: 8-10 Beers nightly  . Drug use: Not Currently    Types: Marijuana  . Sexual activity: Not on file  Other Topics Concern  . Not on file  Social History Narrative  . Not on file   Social Determinants of Health   Financial Resource Strain: Not on file  Food Insecurity: Not on file  Transportation Needs: Not on file  Physical Activity: Not on file  Stress: Not on file  Social Connections: Not on file  Intimate Partner Violence: Not on file    Current Outpatient Medications  Medication Sig Dispense Refill  . ELIQUIS 5 MG TABS tablet Take 1 tablet (5 mg total) by mouth 2 (two) times daily. 180 tablet 3  . ENTRESTO 24-26 MG Take 1 tablet by mouth 2 (two) times daily. 180 tablet 3  . metoprolol tartrate (LOPRESSOR) 25 MG tablet Take 0.5 tablets (12.5 mg total) by mouth 2 (two) times daily. 90 tablet 3  . nicotine (NICODERM CQ - DOSED IN MG/24 HOURS) 21 mg/24hr patch Place 21 mg onto the skin daily.    Marland Kitchen omeprazole (PRILOSEC) 20 MG capsule Take 20 mg by mouth daily.    . Potassium Chloride ER 20 MEQ TBCR Take 20 mEq by mouth 2 (two) times daily. 180 tablet 3  . spironolactone (ALDACTONE) 25 MG tablet Take 1 tablet (25  mg total) by mouth daily. 90 tablet 3  . torsemide (DEMADEX) 20 MG tablet Take 1 tablet (20 mg total) by mouth daily. 90 tablet 3   No current facility-administered medications for this visit.    No Known Allergies    Review of Systems:   General:  normal appetite, normal energy, no weight gain, + weight loss, no fever  Cardiac:  no chest pain with exertion, no chest pain at rest, +SOB with exertion, + resting SOB at the time of recent hospitalization, no PND, no orthopnea, no palpitations, + arrhythmia, + atrial fibrillation, + LE edema, no dizzy spells, no syncope  Respiratory:  + shortness of  breath, no home oxygen, + productive cough, no dry cough, no bronchitis, + wheezing, no hemoptysis, no asthma, no pain with inspiration or cough, + sleep apnea, no CPAP at night  GI:   no difficulty swallowing, no reflux, no frequent heartburn, no hiatal hernia, no abdominal pain, no constipation, no diarrhea, no hematochezia, no hematemesis, no melena  GU:   no dysuria,  no frequency, no urinary tract infection, no hematuria, no enlarged prostate, no kidney stones, no kidney disease  Vascular:  no pain suggestive of claudication, no pain in feet, no leg cramps, no varicose veins, no DVT, no non-healing foot ulcer  Neuro:   no stroke, no TIA's, no seizures, no headaches, no temporary blindness one eye,  no slurred speech, no peripheral neuropathy, no chronic pain, no instability of gait, no memory/cognitive dysfunction  Musculoskeletal: no arthritis, no joint swelling, no myalgias, no difficulty walking, normal mobility   Skin:   no rash, no itching, no skin infections, no pressure sores or ulcerations  Psych:   no anxiety, no depression, no nervousness, no unusual recent stress  Eyes:   no blurry vision, no floaters, no recent vision changes, does not wear glasses or contacts  ENT:   no hearing loss, no loose or painful teeth, no dentures, last saw dentist many years ago  Hematologic:  no easy  bruising, no abnormal bleeding, no clotting disorder, no frequent epistaxis  Endocrine:  no diabetes, does not check CBG's at home     Physical Exam:   BP 110/72 (BP Location: Right Arm, Patient Position: Sitting, Cuff Size: Large)   Pulse (!) 113   Temp 97.7 F (36.5 C) (Skin)   Resp 20   Ht 5' 11" (1.803 m)   Wt 283 lb 12.8 oz (128.7 kg)   SpO2 95% Comment: RA  BMI 39.58 kg/m   General:  Morbidly obese,  well-appearing  HEENT:  Unremarkable   Neck:   no JVD, no bruits, no adenopathy   Chest:   clear to auscultation, symmetrical breath sounds, no wheezes, no rhonchi   CV:   Irregular rate and rhythm, no murmur   Abdomen:  soft, non-tender, no masses   Extremities:  warm, well-perfused, pulses not palpable, no LE edema  Rectal/GU  Deferred  Neuro:   Grossly non-focal and symmetrical throughout  Skin:   Clean and dry, no rashes, no breakdown   Diagnostic Tests:  TRANSESOPHAGEAL ECHOCARDIOGRAM  Report from transesophageal echocardiogram performed January 25, 2021 at [facility] are reviewed.  Images from this examination are not currently available for direct review.  To my recollection I saw a single image from this examination sent via text message that confirmed the presence of the mass in the left atrium.  By report the patient was noted to have normal left ventricular systolic function with ejection fraction estimated 55 to 60% in the setting of atrial fibrillation.  There was no clot in the left atrial appendage.  There was a "dense mass" that was felt to be adherent to the intra-atrial septum with characteristics "suggesting atrial myxoma".  There was reportedly trace mitral regurgitation and mild aortic insufficiency.  No other significant abnormalities were noted.     LEFT HEART CATH AND CORONARY ANGIOGRAPHY    Conclusion    LV end diastolic pressure is severely elevated.  There is no aortic valve stenosis.   SUMMARY  Angiographically Normal Coronary  Arteries.  Severely elevated LVEDP  A. fib RVR  RECOMMENDATIONS  Discharge home at bedrest.  Follow-up as directed.  I did write for additional p.o. 25 mg of Lopressor prior to discharge from Short Stay  We start Xarelto tonight at 2200   Recommendations  Antiplatelet/Anticoag Recommend to resume Apixaban, at currently prescribed dose and frequency on 01/30/2021. 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 male with a hx of persistent atrial fibrillation hypertensive heart disease with heart failure mildly reduced ejection fraction approximately 45% mild mitral and aortic regurgitation and an atrial mass by TEE felt to be myxoma by my partner Dr.Tobb Last seen at hospital discharge.  He is anticoagulate it he had trouble with sleep apnea and nocturnal bradycardia in the hospital his heart failure quickly cleared blood pressure control with the introduction of Entresto diuretic MRA and a low-dose of a beta-blocker.  The case was reviewed with Dr. Darylene Price, CT surgery who advised that we go ahead and schedule him to undergo left heart catheterization prior to scheduling him to be seen in the outpatient setting by Dr. Roxy Manns.  The thought is that he will need resection and consideration of Maze procedure for his atrial fibrillation.  Time Out: Verified patient identification, verified procedure, site/side was marked, verified correct patient position, special equipment/implants available, medications/allergies/relevent history reviewed, required imaging and test results available. Performed.  Access:  RIGHT Radial Artery: 6 Fr sheath -- Seldinger technique using Micropuncture Kit -- Direct ultrasound guidance used.   Permanent image obtained and placed on chart. -- 10 mL radial cocktail IA; 6500 Units IV Heparin  Left Heart Catheterization: 5Fr Catheters advanced or exchanged over a J-wire under direct fluoroscopic guidance into the ascending aorta; TIG 4.0 catheter advanced first.  * LV Hemodynamics (LV Gram): TIG 4.0 Catheter * Left Coronary Artery Cineangiography: TIG 4.0 Catheter  * Right Coronary Artery Cineangiography: JR4 Catheter   Review of initial angiography revealed: Angiographically normal coronary arteries  Upon completion of Angiogaphy, the catheter was removed completely out of the body over a wire, without complication.  Radial sheath removed in the Cardiac Catheterization lab with TR Band placed for hemostasis.  TR Band: 1604  Hours; 13 mL air  MEDICATIONS SQ Lidocaine 3 mL Radial Cocktail: 3 mg Verapmil in 10 mL NS Heparin: 6500 Units IV Lopressor 5 mg x 1 Estimated blood loss <50 mL.   During this procedure medications were administered to achieve and maintain moderate conscious sedation while the patient's heart rate, blood pressure, and oxygen saturation were continuously monitored and I was present face-to-face 100% of this time.   Medications (Filter: Administrations occurring from 1517 to 1617 on 01/30/21) (important) Continuous medications are totaled by the amount administered until 01/30/21 1617.    fentaNYL (SUBLIMAZE) injection (mcg) Total dose:  50 mcg  Date/Time Rate/Dose/Volume Action   01/30/21 1530 50 mcg Given    midazolam (VERSED) injection (mg) Total dose:  3 mg  Date/Time Rate/Dose/Volume Action   01/30/21 1530 2 mg Given   1552 1 mg Given    Heparin (Porcine) in NaCl 1000-0.9 UT/500ML-% SOLN (mL) Total volume:  1,500 mL  Date/Time Rate/Dose/Volume Action   01/30/21 1531 500 mL Given   1531 500 mL Given   1531 500 mL Given    lidocaine (PF) (XYLOCAINE) 1 % injection (mL) Total volume:  2  mL  Date/Time Rate/Dose/Volume Action    01/30/21 1547 2 mL Given    Radial Cocktail/Verapamil only (mL) Total volume:  10 mL  Date/Time Rate/Dose/Volume Action   01/30/21 1548 10 mL Given    heparin sodium (porcine) injection (Units) Total dose:  6,500 Units  Date/Time Rate/Dose/Volume Action   01/30/21 1551 6,500 Units Given    metoprolol tartrate (LOPRESSOR) injection (mg) Total dose:  5 mg  Date/Time Rate/Dose/Volume Action   01/30/21 1554 5 mg Given    iohexol (OMNIPAQUE) 350 MG/ML injection (mL) Total volume:  60 mL  Date/Time Rate/Dose/Volume Action   01/30/21 1605 60 mL Given    Sedation Time  Sedation Time Physician-1: 32 minutes 12 seconds   Contrast  Medication Name Total Dose  iohexol (OMNIPAQUE) 350 MG/ML injection 60 mL    Radiation/Fluoro  Fluoro time: 4.8 (min) DAP: 27215 (mGycm2) Cumulative Air Kerma: 343 (mGy)   Complications   Complications documented before study signed (01/30/2021 5:68 PM)    No complications were associated with this study.  Documented by Leonie Man, MD - 01/30/2021 4:33 PM     Coronary Findings   Diagnostic Dominance: Right  Left Anterior Descending  Vessel is large.  First Diagonal Branch  Vessel is large in size.  Lateral First Diagonal Branch  Vessel is small in size.  Second Diagonal Branch  Vessel is large in size.  Lateral Second Diagonal Branch  Vessel is small in size.  Left Circumflex  First Obtuse Marginal Branch  Vessel is moderate in size.  Second Obtuse Marginal Branch  Vessel is small in size.  Third Obtuse Marginal Branch  Vessel is small in size.  Fourth Obtuse Marginal Branch  Vessel is large in size.  Left Posterior Atrioventricular Artery  Vessel is small in size.  Right Coronary Artery  Acute Marginal Branch  Vessel is small in size.  Right Ventricular Branch  Vessel is small in size.  First Right Posterolateral Branch  Vessel is small in size.   Intervention   No interventions have been  documented.  Wall Motion  Resting    No LV gram         Left Heart  Left Ventricle LV end diastolic pressure is severely elevated.  Aortic Valve There is no aortic valve stenosis. There is normal aortic valve motion.   Coronary Diagrams   Diagnostic Dominance: Right    Intervention    Implants    No implant documentation for this case.    Syngo Images  Show images for CARDIAC CATHETERIZATION  Images on Long Term Storage  Show images for [place], [place]  Link to Procedure Log  Procedure Log     Hemo Data  Flowsheet Row Most Recent Value  AO Systolic Pressure 96 mmHg  AO Diastolic Pressure 67 mmHg  AO Mean 80 mmHg  LV Systolic Pressure 98 mmHg  LV Diastolic Pressure 17 mmHg  LV EDP 31 mmHg  AOp Systolic Pressure 616 mmHg  AOp Diastolic Pressure 62 mmHg  AOp Mean Pressure 81 mmHg  LVp Systolic Pressure 837 mmHg  LVp Diastolic Pressure 16 mmHg  LVp EDP Pressure 31 mmHg      Impression:  Patient has documented history of left atrial mass with characteristics felt to be suggestive of atrial myxoma.  He recently presented with presumably new onset persistent atrial fibrillation associated with rapid ventricular response and acute diastolic congestive heart failure.  According to the patient clinical symptoms of shortness of breath and fluid overload  have resolved on current medical therapy.  I have personally reviewed the patient's recent diagnostic cardiac catheterization but I have not been able to review the entire transesophageal echocardiogram.  Based upon presented history the patient would likely benefit from elective surgical intervention for removal of the left atrial mass for purposes of both definitive diagnosis and decreased risk of embolization.  He might benefit from concomitant Maze procedure at the time of surgery.   Plan:  I have discussed the nature of the patient's presenting diagnosis at length with the patient and his  mother in the office today.  The rationale for elective surgical intervention is been discussed as well as concerns about risks of embolization in the future if the left atrial mass is left alone.  The patient is interested in proceeding with surgery in the near future.  As a next step we will attempt to locate a copy of the patient's recent transesophageal echocardiogram for formal review.  The patient will be referred for dental service consultation for routine cleaning and examination.  He will also undergo CT angiography to evaluate the feasibility of peripheral cannulation for surgery.  We will tentatively plan for elective resection of left atrial mass, Maze procedure, and possible mitral valve repair on March 06, 2021.  The patient will return for follow-up prior to surgery on March 04, 2021.   I spent in excess of 90 minutes during the conduct of this office consultation and >50% of this time involved direct face-to-face encounter with the patient for counseling and/or coordination of their care.    Valentina Gu. Roxy Manns, MD 02/18/2021 11:46 AM

## 2021-02-18 NOTE — Telephone Encounter (Signed)
*  STAT* If patient is at the pharmacy, call can be transferred to refill team.   1. Which medications need to be refilled? (please list name of each medication and dose if known)  spironolactone (ALDACTONE) 25 MG tablet torsemide (DEMADEX) 20 MG tablet ENTRESTO 24-26 MG ELIQUIS 5 MG TABS tablet metoprolol tartrate (LOPRESSOR) 25 MG tablet Potassium Chloride ER 20 MEQ TBCR   2. Which pharmacy/location (including street and city if local pharmacy) is medication to be sent to? [id number] - [place], [place] - [address]  3. Do they need a 30 day or 90 day supply? 90 day

## 2021-02-18 NOTE — Telephone Encounter (Signed)
Pt c/o medication issue:  1. Name of Medication: magnesium oxide 400 mg  2. How are you currently taking this medication (dosage and times per day)? 1 tablet twice a day   3. Are you having a reaction (difficulty breathing--STAT)? no  4. What is your medication issue? Patient's mother called for a refill of the medication, but it is not currently on his medication list.

## 2021-02-19 ENCOUNTER — Encounter: Payer: Self-pay | Admitting: *Deleted

## 2021-02-19 ENCOUNTER — Telehealth: Payer: Self-pay

## 2021-02-19 ENCOUNTER — Telehealth: Payer: Self-pay | Admitting: Cardiology

## 2021-02-19 NOTE — Telephone Encounter (Signed)
Follow up:     Patient mother returning a call back from early today. Please call back.

## 2021-02-19 NOTE — Telephone Encounter (Signed)
Left message on patients voicemail to please return our call.   

## 2021-02-19 NOTE — Telephone Encounter (Signed)
-----   Message from Richardo Priest, MD sent at 02/19/2021  8:00 AM EST ----- Regarding: RE: OK Dr Ellyn Hack told me he routed it  I will forward to my office nurse ----- Message ----- From: Laury Deep, RN Sent: 02/18/2021   7:07 PM EST To: Richardo Priest, MD, Rexene Alberts, MD Subject: RE:                                            I called over to the cath lab and they looked everywhere it would normally be and they did not have it.  We also checked the only area it would be in our office, still nothing.  Best bet is to get it re-sent.  Thanks, Ryan ----- Message ----- From: Rexene Alberts, MD Sent: 02/18/2021   5:14 PM EST To: Richardo Priest, MD, Laury Deep, RN Subject: RE:                                            I am told that the cath lab doesn't know where it went and they definitely didn't bring it to our office.  Do you recall who you gave it to?  We can get another copy from [facility] if we have to  ----- Message ----- From: Richardo Priest, MD Sent: 02/18/2021   4:46 PM EST To: Rexene Alberts, MD Subject: RE:                                            He hand carried the TEE disc to the cath lab and I was told it went to your office ----- Message ----- From: Rexene Alberts, MD Sent: 02/18/2021   1:59 PM EST To: Richardo Priest, MD  Please see the attached note regarding our mutual patient from their visit in our office today.  CHO

## 2021-02-20 ENCOUNTER — Telehealth: Payer: Self-pay | Admitting: Cardiology

## 2021-02-20 ENCOUNTER — Telehealth (HOSPITAL_COMMUNITY): Payer: Self-pay

## 2021-02-20 NOTE — Telephone Encounter (Signed)
I called and left message on patients machine for him to call Dental Medicine to schedule a New Outpatient Appointment.

## 2021-02-20 NOTE — Telephone Encounter (Signed)
Spoke to the patients mother just now and let her know that this is the correct CD that needs to be taken to Dr. Roxy Manns. She states that she will do this and thanks me for the call back.    Encouraged patient to call back with any questions or concerns.

## 2021-02-20 NOTE — Telephone Encounter (Signed)
   Pt's mother said, she got a CD that's says echo on it. She wanted to know if that's the CD she needs to bring to Dr. Roxy Manns

## 2021-02-25 ENCOUNTER — Other Ambulatory Visit: Payer: Self-pay

## 2021-02-25 ENCOUNTER — Ambulatory Visit (HOSPITAL_COMMUNITY)
Admission: RE | Admit: 2021-02-25 | Discharge: 2021-02-25 | Disposition: A | Discharge status: Home / Self Care | Payer: BC Managed Care – PPO | Source: Ambulatory Visit | Attending: Thoracic Surgery (Cardiothoracic Vascular Surgery) | Admitting: Thoracic Surgery (Cardiothoracic Vascular Surgery)

## 2021-02-25 ENCOUNTER — Encounter (HOSPITAL_COMMUNITY): Payer: Self-pay

## 2021-02-25 DIAGNOSIS — I517 Cardiomegaly: Secondary | ICD-10-CM | POA: Diagnosis not present

## 2021-02-25 DIAGNOSIS — I5189 Other ill-defined heart diseases: Secondary | ICD-10-CM | POA: Insufficient documentation

## 2021-02-25 DIAGNOSIS — Z01818 Encounter for other preprocedural examination: Secondary | ICD-10-CM | POA: Diagnosis not present

## 2021-02-25 DIAGNOSIS — J90 Pleural effusion, not elsewhere classified: Secondary | ICD-10-CM | POA: Diagnosis not present

## 2021-02-25 MED ORDER — IOHEXOL 350 MG/ML SOLN
100.0000 mL | Freq: Once | INTRAVENOUS | Status: AC | PRN
  Administered 2021-02-25: 100 mL via INTRAVENOUS

## 2021-02-26 ENCOUNTER — Telehealth: Payer: Self-pay | Admitting: Thoracic Surgery (Cardiothoracic Vascular Surgery)

## 2021-02-26 ENCOUNTER — Encounter (HOSPITAL_COMMUNITY): Payer: Self-pay

## 2021-02-26 ENCOUNTER — Other Ambulatory Visit: Payer: Self-pay | Admitting: Thoracic Surgery (Cardiothoracic Vascular Surgery)

## 2021-02-26 ENCOUNTER — Other Ambulatory Visit: Payer: Self-pay | Admitting: *Deleted

## 2021-02-26 ENCOUNTER — Encounter: Payer: Self-pay | Admitting: Thoracic Surgery (Cardiothoracic Vascular Surgery)

## 2021-02-26 DIAGNOSIS — N2889 Other specified disorders of kidney and ureter: Secondary | ICD-10-CM

## 2021-02-26 NOTE — Telephone Encounter (Signed)
Images from the patient's recent transesophageal echocardiogram were personally reviewed and reviewed with our multidisciplinary heart valve team this morning.  In summary, we disagree with the interpretation of the findings documented in the original TEE report.  We are not convinced that there is a significant mass in the left atrium suspicious for atrial myxoma.  Moreover, there is at least moderate if not severe aortic insufficiency.  Overall the study quality was poor and should be repeated.  I attempted to contact the patient over the telephone on several occasions and ultimately spoke with his mother.  We will cancel plans for surgery but have the patient keep his follow-up appointment with me in the office this coming Monday so that we can review these findings and discuss next steps.  At the very least we will plan repeat transesophageal echocardiogram and consider cardiac MRI.  In addition we will plan dedicated renal MRI to evaluate the opacity noted on recent CT angiogram.  I asked the patient's mother to make sure that the patient continues to take his Eliquis and does not hold any medications since we are canceling plans for surgery for the time being.  I also asked the patient's mother to make sure that he keeps his appointment in the dental service clinic.  All questions answered.  Rexene Alberts, MD 02/26/2021 10:50 AM

## 2021-02-27 ENCOUNTER — Ambulatory Visit (INDEPENDENT_AMBULATORY_CARE_PROVIDER_SITE_OTHER): Payer: Self-pay | Admitting: Dentistry

## 2021-02-27 ENCOUNTER — Other Ambulatory Visit: Payer: Self-pay

## 2021-02-27 ENCOUNTER — Telehealth: Payer: Self-pay

## 2021-02-27 VITALS — BP 119/83 | HR 73 | Temp 98.2°F

## 2021-02-27 DIAGNOSIS — K0530 Chronic periodontitis, unspecified: Secondary | ICD-10-CM

## 2021-02-27 DIAGNOSIS — I5189 Other ill-defined heart diseases: Secondary | ICD-10-CM

## 2021-02-27 DIAGNOSIS — Z7901 Long term (current) use of anticoagulants: Secondary | ICD-10-CM

## 2021-02-27 DIAGNOSIS — M278 Other specified diseases of jaws: Secondary | ICD-10-CM

## 2021-02-27 DIAGNOSIS — M270 Developmental disorders of jaws: Secondary | ICD-10-CM

## 2021-02-27 DIAGNOSIS — I480 Paroxysmal atrial fibrillation: Secondary | ICD-10-CM

## 2021-02-27 DIAGNOSIS — K036 Deposits [accretions] on teeth: Secondary | ICD-10-CM

## 2021-02-27 DIAGNOSIS — K029 Dental caries, unspecified: Secondary | ICD-10-CM

## 2021-02-27 DIAGNOSIS — K08109 Complete loss of teeth, unspecified cause, unspecified class: Secondary | ICD-10-CM

## 2021-02-27 DIAGNOSIS — Z01818 Encounter for other preprocedural examination: Secondary | ICD-10-CM

## 2021-02-27 NOTE — Patient Instructions (Signed)
[facility] Dr. Ranika Mcniel B. Katianna Mcclenney, D.M.D. Phone: [phone] Fax: [phone]   It was a pleasure seeing you today!  Please refer to the information below regarding your dental visit, and call us should you have any questions or concerns that may come up after you leave.   Thank you for giving us the opportunity to provide care for you.  If there is anything we can do for you, please let us know.    HEART VALVES AND MOUTH CARE   FACTS:  If you have any infection in your mouth, it can infect your heart valve.  If you heart valve is infected, you will be seriously ill.  Infections in the mouth can be SILENT and do not always cause pain.  Examples of infections in the mouth are gum disease, dental cavities, and abscesses.  Some possible signs of infection are: Bad breath, bleeding gums, or teeth that are sensitive to sweets, hot, and/or cold. There are many other signs as well.   WHAT YOU HAVE TO DO:  Brush your teeth after meals and at bedtime. Spend at least 2 minutes brushing well, especially behind your back teeth and all around your teeth that stand alone. Brush at the gumline also.  Do not go to bed without brushing your teeth and flossing.  If your gums bleed when you brush or floss, do NOT stop brushing or flossing.  Bleeding can be a sign of inflammation or irritation from bacteria.  It usually means that your gums need more attention and better cleaning.   If your Dentist or Dr. Kerra Guilfoil gave you a prescription mouthwash to use, make sure to use it as directed. If you run out of the medication, get a refill at the pharmacy.  If you were given any other medications or directions by your Dentist, please follow them. If you did not understand the directions or forget what you were told, please call. We will be happy to refresh your memory.  If you need antibiotics before dental procedures, make sure you take them one hour prior to every  dental visit as directed.   Get a dental check-up every 4-6 months in order to keep your mouth healthy, or to find and treat any new infection. You will most likely need your teeth cleaned or gums treated at the same time.  If you are not able to come in for your scheduled appointment, call your Dentist as soon as possible to reschedule.  If you have a problem in between dental visits, call your Dentist.    QUESTIONS? . Call our office during office hours [phone].   WE ARE A TEAM.  OUR GOAL IS:  HEALTHY MOUTH, HEALTHY HEART 

## 2021-02-27 NOTE — Telephone Encounter (Signed)
-----   Message from Laury Deep, RN sent at 02/26/2021  1:18 PM EDT ----- Eliezer Lofts,   See below please.  Dr. Roxy Manns wants this patient set up for a TEE by Dr. Margaretann Loveless.  Thanks so much, Ryan ----- Message ----- From: Laury Deep, RN Sent: 02/26/2021  11:03 AM EDT To: Elouise Munroe, MD  Hey,   Per Dr. Roxy Manns, he wants this patient to get set up for a TEE with you please.  Can you and your nurse help with this?  Thanks, [employer]

## 2021-02-27 NOTE — Progress Notes (Signed)
Department of Dental Medicine     OUTPATIENT CONSULTATION  Service Date:   02/27/2021  Patient Name:  Daniel Vance Date of Birth:   March 16, 1976 Medical Record Number: [id number]  Referring Provider:              Darylene Price, MD   PLAN & RECOMMENDATIONS  RECOMMENDATIONS > There are no current signs of acute dental infection including abscess, edema or erythema, or suspicious lesion requiring biopsy.  The patient does have periodontal disease and multiple teeth with questionable periodontal prognosis. >> There are no current recommendations for dental treatment to be completed prior to the patient's anticipated cardiac surgery.  Recommend that the patient establish care at an outside dental office of his choice for routine dental care including periodontal therapy and exams to decrease the risk of systemic infection and/or other complications. >>> Plan to discuss case with medical team and coordinate care as needed.  >>  Discussed in detail all treatment options with the patient and they are agreeable to the plan.   Thank you for consulting with [facility] and for the opportunity to participate in this patient's treatment.  Should you have any questions or concerns, please contact the [facility] at [phone].   02/27/2021      CONSULT NOTE   COVID 19 SCREENING: The patient denies symptoms concerning for COVID-19 infection including fever, chills, cough, or newly developed shortness of breath.   HISTORY OF PRESENT ILLNESS: > Daniel Vance is a very pleasant 45 y.o. male with h/o HTN, atrial fibrillation on long-term anticoagulation (Eliquis), CHF, mitral regurgitation, sleep apnea and asthma who was originally anticipating cardiac surgery on 3/23, however this has been cancelled and the patient's surgery is now uncertain and will be needing further work-up per cardiothoracic surgery's note..  The patient presents today for a medically necessary  dental consultation as part of their pre-cardiac surgical work-up.  DENTAL HISTORY: > The patient reports that he does not have a dentist he sees regularly.  He reports brushing his teeth twice daily but admits to not flossing.  He currently denies any dental/orofacial pain or sensitivity. >> Patient is able to manage oral secretions.  Patient denies dysphagia, odynophagia, dysphonia, SOB and neck pain.  Patient denies fever, rigors and malaise.   CHIEF COMPLAINT: Here for a preoperative dental consultation.   Patient Active Problem List   Diagnosis Date Noted  . Aortic regurgitation 02/12/2021  . Chronic combined systolic (congestive) and diastolic (congestive) heart failure ([place]) 02/12/2021  . Hypertensive heart disease 02/12/2021  . Mild mitral regurgitation 02/12/2021  . Mitral annular calcification 02/12/2021  . PVCs (premature ventricular contractions) 02/12/2021  . CHF (congestive heart failure) ([place]) 02/12/2021  . New onset a-fib ([place]) 02/12/2021  . Sleep apnea   . Hypertension   . Heart failure ([place])   . Persistent atrial fibrillation ([place])   . Asthma   . 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 PAF (paroxysmal atrial fibrillation) ([place]) 01/30/2021   Past Medical History:  Diagnosis Date  . Asthma    As a child  . Atrial fibrillation ([place])   . Heart failure ([place])   . Hypertension   . 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 Sleep apnea    Past Surgical History:  Procedure Laterality Date  . LEFT HEART CATH AND CORONARY ANGIOGRAPHY N/A 01/30/2021   Procedure: LEFT HEART CATH AND CORONARY ANGIOGRAPHY;  Surgeon: Leonie Man, MD;  Location: [facility];  Service: Cardiovascular;  Laterality: N/A;  . NONE     No Known Allergies Current Outpatient Medications  Medication Sig Dispense Refill  . ELIQUIS 5 MG TABS tablet Take 1 tablet (5 mg total) by mouth 2 (two) times daily. 180 tablet 3  . ENTRESTO 24-26 MG Take 1 tablet by mouth 2 (two) times daily. 180 tablet 3  .  metoprolol tartrate (LOPRESSOR) 25 MG tablet Take 0.5 tablets (12.5 mg total) by mouth 2 (two) times daily. 90 tablet 3  . nicotine (NICODERM CQ - DOSED IN MG/24 HOURS) 21 mg/24hr patch Place 21 mg onto the skin at bedtime.    Marland Kitchen omeprazole (PRILOSEC) 20 MG capsule Take 20 mg by mouth daily.    . Potassium Chloride ER 20 MEQ TBCR Take 20 mEq by mouth 2 (two) times daily. 180 tablet 3  . spironolactone (ALDACTONE) 25 MG tablet Take 1 tablet (25 mg total) by mouth daily. 90 tablet 3  . torsemide (DEMADEX) 20 MG tablet Take 1 tablet (20 mg total) by mouth daily. 90 tablet 3   No current facility-administered medications for this visit.    LABS: Lab Results  Component Value Date   WBC 9.4 01/28/2021   HGB 18.4 (H) 01/28/2021   HCT 54.7 (H) 01/28/2021   MCV 90 01/28/2021   PLT 265 01/28/2021      Component Value Date/Time   NA 137 01/28/2021 1131   K 4.6 01/28/2021 1131   CL 97 01/28/2021 1131   CO2 23 01/28/2021 1131   GLUCOSE 122 (H) 01/28/2021 1131   BUN 14 01/28/2021 1131   CREATININE 1.20 01/28/2021 1131   CALCIUM 9.4 01/28/2021 1131   GFRNONAA 74 01/28/2021 1131   GFRAA 85 01/28/2021 1131   No results found for: INR, PROTIME No results found for: PTT  Social History   Socioeconomic History  . Marital status: Single    Spouse name: Not on file  . Number of children: Not on file  . Years of education: Not on file  . Highest education level: Not on file  Occupational History  . Not on file  Tobacco Use  . Smoking status: Former Smoker    Packs/day: 1.00    Years: 25.00    Pack years: 25.00    Types: Cigarettes    Quit date: 01/14/2021    Years since quitting: 0.1  . Smokeless tobacco: Never Used  Vaping Use  . Vaping Use: Never used  Substance and Sexual Activity  . Alcohol use: Yes    Comment: 8-10 Beers nightly  . Drug use: Not Currently    Types: Marijuana  . Sexual activity: Not on file  Other Topics Concern  . Not on file  Social History Narrative  .  Not on file   Social Determinants of Health   Financial Resource Strain: Not on file  Food Insecurity: Not on file  Transportation Needs: Not on file  Physical Activity: Not on file  Stress: Not on file  Social Connections: Not on file  Intimate Partner Violence: Not on file   Family History  Problem Relation Age of Onset  . Stomach cancer Father   . Hypertension Father   . Heart attack Father   . Heart failure Father     REVIEW OF SYSTEMS: Reviewed with the patient as per HPI. PSYCH: Patient denies having dental phobia.  VITAL SIGNS: BP 119/83 (BP Location: Right Arm)   Pulse 73   Temp 98.2 F (36.8 C) (Oral)    PHYSICAL EXAM: >>  General:  Well-developed, comfortable and in no apparent distress. >> Neurological:  Alert and oriented to person, place and  time. >> Extraoral:  Facial symmetry present without any edema or erythema.  No swelling or lymphadenopathy.  TMJ asymptomatic without clicks or crepitations. >> Maximum Interincisal Opening: 55 mm >> Intraoral:  Soft tissues appear well-perfused and mucous membranes moist.  FOM and vestibules soft and not raised. Oral cavity without mass or lesion. No signs of infection, parulis, sinus tract, edema or erythema evident upon exam.   (+) Bilateral mandibular tori, (+) Bilateral maxillary exostoses   DENTAL EXAM: Hard tissue exam completed and charted. >> Dentition:  Overall fair remaining dentition.  Missing teeth, caries, generalized staining present.   >> The patient is maintaining poor oral hygiene.    >> Periodontal: Inflamed, erythematous gingival tissue. Generalized moderate to heavy calculus accumulation. Class I mobility tooth numbers 1, 18, 23, 24, 25, 26 and 30. Class II-III mobility tooth #19. >> Caries:  #30 occlusal pit caries, #32 buccal caries.  >> Removable/Fixed Prosthodontics: Patient denies wearing partial dentures. >> Occlusion: Class I molar occlusion. Teeth numbers 1, 2, 3, 14, 15, 16, 17, 18, 19,  30, 31 and 32 drifted mesially.   RADIOGRAPHIC EXAM: PAN and Full Mouth Series exposed and interpreted.  >> Condyles seated bilaterally in fossas.  No evidence of abnormal pathology.  All visualized osseous structures appear WNL. Radiopacity present bilaterally near mandibular anterior teeth consistent with tori.  >> Generalized moderate horizontal bone loss with areas of localized severe consistent with moderate to severe periotontitis.  Radiographic calculus accumulation evident. Teeth #19, 20, 23, 24, 25 severe bone loss- #19 has furcation radiolucency. >> Missing teeth numbers 4, 13, 20 and 29. Caries, existing restorations.   ASSESSMENT:  1. Atrial mass, atrial fibrillation 2. Pre-cardiac surgery dental consultation 3. Long-term (current use) of anticoagulation (on Eliquis) 4. Missing teeth 5. Caries 6. Accretions on teeth 7. Chronic periodontitis 8. Mandibular tori 9. Maxillary exostoses 10. Postoperative bleeding risk   PLAN AND RECOMMENDATIONS: >  I discussed the risks, benefits, and complications of various scenarios with the patient in relationship to their medical and dental conditions, which included systemic infection such as endocarditis, bacteremia or other serious issues that could potentially occur either before, during or after their anticipated surgery if dental/oral concerns are not addressed.  I explained that if any chronic or acute dental/oral infection(s) are addressed and subsequently not maintained following medical optimization and recovery, their risk of the previously mentioned complications are just as high and could potentially occur postoperatively.  I explained all significant findings of the dental consultation with the patient including generalized significant bone loss consistent with periodontal disease progression and localized mobile teeth, particularly teeth numbers 19, 20, and 23-26, which have a questionable to poor periodontal prognosis, and the  recommended care including routine dental care which would involve a deep cleaning (periodontal therapy) and comprehensive treatment in order to optimize them from a dental standpoint. I explained to the patient that although there is nothing urgent that needs to be addressed prior to him having heart surgery, that does not mean he is not at risk for developing complications due to his oral health, especially if his periodontal disease is not addressed. The patient verbalized understanding of all findings, discussion, and recommendations. >>  We then discussed various treatment options to include no treatment, multiple extractions with alveoloplasty, pre-prosthetic surgery as indicated, periodontal therapy, dental restorations, root canal therapy, crown and bridge therapy, implant therapy, and  replacement of missing teeth as indicated.  The patient verbalized understanding of all options, and currently wishes to proceed with dental treatment as indicated (periodontal therapy, restorations, etc.) once he is optimized from a medical standpoint to have elective dental care completed.  He agreed to schedule an appointment with a local dental provider who take his insurance so that he can establish care as soon as he has been cleared by his medical team. >>>  Plan to discuss all findings and recommendations with medical team and coordinate future care as needed.  <> The patient tolerated today's visit well.  All questions and concerns were addressed and answered, and the patient departed in stable condition.   I spent in excess of 120 minutes during the conduct of this consultation and >50% of this time involved direct face-to-face encounter for counseling and/or coordination of the patient's care. Linden Benson [place], D.M.D.

## 2021-02-27 NOTE — Telephone Encounter (Signed)
Attempted to call patient to set up patients TEE per staff messages listed. Left message on both numbers listed in chart for patient to return call to office when able. Call back number left on patients VM.

## 2021-02-28 ENCOUNTER — Other Ambulatory Visit: Payer: Self-pay | Admitting: Thoracic Surgery (Cardiothoracic Vascular Surgery)

## 2021-02-28 DIAGNOSIS — N2889 Other specified disorders of kidney and ureter: Secondary | ICD-10-CM

## 2021-03-04 ENCOUNTER — Inpatient Hospital Stay (HOSPITAL_COMMUNITY): Admission: RE | Admit: 2021-03-04 | Payer: BC Managed Care – PPO | Source: Ambulatory Visit

## 2021-03-04 ENCOUNTER — Encounter: Payer: Self-pay | Admitting: Thoracic Surgery (Cardiothoracic Vascular Surgery)

## 2021-03-04 ENCOUNTER — Other Ambulatory Visit: Payer: Self-pay

## 2021-03-04 ENCOUNTER — Ambulatory Visit: Payer: BC Managed Care – PPO | Admitting: Thoracic Surgery (Cardiothoracic Vascular Surgery)

## 2021-03-04 ENCOUNTER — Other Ambulatory Visit (HOSPITAL_COMMUNITY): Payer: BC Managed Care – PPO

## 2021-03-04 VITALS — BP 123/73 | HR 80 | Resp 20 | Ht 71.0 in | Wt 279.0 lb

## 2021-03-04 DIAGNOSIS — I4819 Other persistent atrial fibrillation: Secondary | ICD-10-CM | POA: Diagnosis not present

## 2021-03-04 DIAGNOSIS — I5189 Other ill-defined heart diseases: Secondary | ICD-10-CM | POA: Diagnosis not present

## 2021-03-04 DIAGNOSIS — I351 Nonrheumatic aortic (valve) insufficiency: Secondary | ICD-10-CM | POA: Diagnosis not present

## 2021-03-04 NOTE — Addendum Note (Signed)
Addended by: Rexanne Mano B on: 03/04/2021 05:55 PM   Modules accepted: Orders

## 2021-03-04 NOTE — Patient Instructions (Signed)
Continue all previous medications without any changes at this time  

## 2021-03-04 NOTE — Progress Notes (Signed)
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  He was originally seen in consultation on February 18, 2021, but at that time a copy of his TEE was not available for review.  Since then the patient's TEE has been reviewed by myself and a multidisciplinary team of specialists, and the patient and his mother return to our office to discuss next steps.  He reports no new problems or complaints over the last 2 weeks.  He has been working without any problems and he specifically denies any symptoms of exertional shortness of breath or chest discomfort.   Current Outpatient Medications  Medication Sig Dispense Refill  . ELIQUIS 5 MG TABS tablet Take 1 tablet (5 mg total) by mouth 2 (two) times daily. 180 tablet 3  . ENTRESTO 24-26 MG Take 1 tablet by mouth 2 (two) times daily. 180 tablet 3  . metoprolol tartrate (LOPRESSOR) 25 MG tablet Take 0.5 tablets (12.5 mg total) by mouth 2 (two) times daily. 90 tablet 3  . nicotine (NICODERM CQ - DOSED IN MG/24 HOURS) 21 mg/24hr patch Place 21 mg onto the skin at bedtime.    Marland Kitchen omeprazole (PRILOSEC) 20 MG capsule Take 20 mg by mouth daily.    . Potassium Chloride ER 20 MEQ TBCR Take 20 mEq by mouth 2 (two) times daily. 180 tablet 3  . spironolactone (ALDACTONE) 25 MG tablet Take 1 tablet (25 mg total) by mouth daily. 90 tablet 3  . torsemide (DEMADEX) 20 MG tablet Take 1 tablet (20 mg total) by mouth daily. 90 tablet 3   No current facility-administered medications for this visit.      Physical Exam:   BP 123/73 (BP Location: Right Arm, Patient Position: Sitting)   Pulse 80   Resp 20   Ht 5\' 11"  (1.803 m)   Wt 279 lb (126.6 kg)   SpO2 91%  Comment: RA  BMI 38.91 kg/m   General:  Obese but well-appearing  Chest:   Clear to auscultation  CV:   Regular rate and rhythm without murmur  Incisions:  n/a  Abdomen:  Soft nontender  Extremities:  Warm and well-perfused  Diagnostic Tests:  TRANSESOPHAGEAL ECHOCARDIOGRAM  Images from the patient's previous transesophageal echocardiogram performed at [facility] are personally reviewed.  There is nothing adherent to the intra-atrial septum that would be suspicious for atrial myxoma.  In one view there is a questionable lesion incorporated into the posterior mitral leaflet at its base, close to the mitral annulus of unclear significance.  It does not appear to be mobile but could conceivably be consistent with a papillary fibroelastoma or soft tissue within the mitral annulus and posterior AV groove.  It is not well visualized.  There is some billowing of the posterior leaflet without significant prolapse.  There does not appear to be significant mitral regurgitation.  The patient does have at least moderate aortic insufficiency.  The aortic valve appears trileaflet.  The aortic insufficiency was not quantified.  Left ventricular size and function appear normal.    CT ANGIOGRAPHY CHEST, ABDOMEN AND PELVIS  TECHNIQUE:  Non-contrast CT of the chest was initially obtained.  Multidetector CT imaging through the chest, abdomen and pelvis was performed using the standard protocol during bolus administration of intravenous contrast. Multiplanar reconstructed images and MIPs were obtained and reviewed to evaluate the vascular anatomy.  CONTRAST:  127mL OMNIPAQUE IOHEXOL 350 MG/ML SOLN  COMPARISON:  CTA chest 01/21/2021  FINDINGS: CTA CHEST FINDINGS  Cardiovascular: Right side of the heart is poorly characterized due to the phase of contrast. Contrast is predominantly in the left heart and aorta. There is significant motion artifact involving the ascending thoracic aorta and  limited evaluation for dissection. No evidence for thoracic aortic aneurysm. Variant arch anatomy with a common trunk for the innominate artery and left common carotid artery. Left vertebral artery originates directly from the aortic arch and proximal to the left subclavian artery. Great vessels are widely patent. No significant atherosclerotic disease in the thoracic aorta or great vessels. Heart is enlarged. No significant pericardial effusion.Internal mammary arteries are patent bilaterally.  Mediastinum/Nodes: Again noted are small mediastinal lymph nodes. Prevascular lymph node on sequence 4, image 20 measures 5 mm in the short axis and previously measured 11 mm. Overall, these lymph nodes have decreased in size compared to the exam from 01/21/2021. Small amount of soft tissue in the right infrahilar region on sequence 4, image 49 measures 12 mm in the short axis but this area is difficult to compare due to pleural fluid on the previous examination. Right hilar tissue on sequence 4, 52 measures roughly 8 mm in the short axis and previously measured 10 mm. Esophagus is unremarkable. No significant axillary lymph node enlargement.  Lungs/Pleura: Trachea and mainstem bronchi are patent. Pleural effusions have resolved from the prior examination. Significant motion artifact throughout the lungs but there is no significant airspace disease or lung consolidation.  Musculoskeletal: Bone structures are unremarkable.  Review of the MIP images confirms the above findings.  CTA ABDOMEN AND PELVIS FINDINGS  VASCULAR  Aorta: Normal caliber aorta without aneurysm, dissection, vasculitis or significant stenosis.  Celiac: Patent without evidence of aneurysm, dissection, vasculitis or significant stenosis.  SMA: Patent without evidence of aneurysm, dissection, vasculitis or significant stenosis.  Renals: Both renal arteries are patent without evidence of aneurysm, dissection  or significant stenosis.  IMA: Patent without evidence of aneurysm, dissection, vasculitis or significant stenosis.  Inflow: Small amount of wall calcifications involving the right common iliac artery without stenosis. Bilateral common, external and internal iliac arteries are widely patent without aneurysm or dissection. Minimal atherosclerotic calcifications in the left internal iliac artery. Proximal femoral arteries are patent.  Veins: No obvious venous abnormality within the limitations of this arterial phase study.  Review of the MIP images confirms the above findings.  NON-VASCULAR  Hepatobiliary: Normal appearance of the liver and gallbladder. No significant biliary dilatation.  Pancreas: Unremarkable. No pancreatic ductal dilatation or surrounding inflammatory changes.  Spleen: Normal in size without focal abnormality.  Adrenals/Urinary Tract: Normal appearance of the adrenal glands. Normal appearance of the right kidney without hydronephrosis. This concern for a small peripheral lesion in the mid/lower left kidney on sequence 4, image 124. This area roughly measures up to 11 mm on the coronal formats, sequence 5 image 64. This could represent a small enhancing lesion. There is a small hypodensity in the left kidney lower pole that probably represents a cyst. Negative for left hydronephrosis. Mild wall thickening involving the anterior and superior aspect of the urinary bladder of uncertain etiology.  Stomach/Bowel: Stomach is within normal  limits. Appendix appears normal. No evidence of bowel wall thickening, distention, or inflammatory changes.  Lymphatic: Mildly prominent periaortic lymph nodes. Index lymph node between the aorta and IVC on sequence 4, image 144 measures 9 mm in the short axis.  Reproductive: Prostate is unremarkable.  Other: Negative for ascites. Evidence for a tiny umbilical hernia containing fat.  Musculoskeletal: No acute  bone abnormality.  Review of the MIP images confirms the above findings.  IMPRESSION: 1. No significant atherosclerotic disease involving the thoracic or abdominal aorta. Visceral arteries are widely patent. Minimal atherosclerotic disease in the iliac arteries. 2. Variant aortic arch anatomy as described. 3. **An incidental finding of potential clinical significance has been found. Concern for a small peripheral lesion in the left kidney measuring up to 11 mm. A small enhancing renal neoplasm cannot be excluded. Recommend further characterization with a dedicated renal MRI, with and without contrast.** 4. Asymmetric wall thickening in the urinary bladder. Consider urology consultation. 5. Cardiomegaly. 6. Bilateral pleural effusions have resolved since 01/21/2021. 7. Mild chest lymphadenopathy but this has decreased since 01/21/2021 and may be reactive.  These results will be called to the ordering clinician or representative by the Radiologist Assistant, and communication documented in the PACS or [employer].   Electronically Signed   By: Markus Daft M.D.   On: 02/25/2021 17:12   Impression:  Patient has relatively small soft tissue lesion incorporated within the posterior mitral annulus and/or posterior mitral valve leaflet of unclear significance.  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  It does not look anything like an atrial myxoma, and it may be myxomatous disease within the mitral valve complex itself.  The patient does have at least moderate aortic insufficiency, and he recently presented with new onset persistent atrial fibrillation and diastolic congestive heart failure.  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  Recent diagnostic cardiac  catheterization revealed normal coronary arteries with no significant coronary artery disease although there was severely elevated left ventricular end-diastolic pressure.  In addition, CTA of the chest, abdomen, and pelvis revealed a small incidental finding involving a peripheral lesion in the left kidney of unclear significance.  The patient's previous pleural effusions and reactive lymphadenopathy have resolved.    Plan:  After reviewing with Dr. Margaretann Loveless and other members of our multidisciplinary heart valve team we favor proceeding with cardiac MRI to better characterize the small soft tissue nodular mass noted within the posterior mitral valve complex.  Quantification of the severity of aortic insufficiency can be done at the same time.  He has already been scheduled for abdominal MRI to evaluate his left renal nodular lesion.  Depending upon findings repeat TEE may be indicated.  Patient will remain on current medical therapy and return to our office in approximately 3 to 4 weeks once both MRI's have been completed.     I spent in excess of 15 minutes during the conduct of this office consultation and >50% of this time involved direct face-to-face encounter with the patient for counseling and/or coordination of their care.   Valentina Gu. Roxy Manns, MD 03/04/2021 3:09 PM

## 2021-03-04 NOTE — Telephone Encounter (Signed)
Spoke with patients mother (okay per DPR), advised patients mom that order has been placed for Cardiac MRI. 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 Patients mom asking if patient needs another Xray of his eyes before his Cardiac MRI. Advised patients mom that I would forward message to Dr. Margaretann Loveless for her to review. Patients mom verbalized understanding.    Elouise Munroe, MD  Laury Deep, RN; Clinton Gallant, Belinda Block, RN; Donato Heinz, MD Spoke to Dr. Roxy Manns, we reviewed images together. I've also reviewed with Gayla Doss. We think cardiac MRI might be next best test. Eliezer Lofts, can you set this up? Question is: rule out atrial mass, quantitate AR. Please list it for me or Gardiner Rhyme to read. Gerald Stabs, think you could schedule this on a day when you're supervising?

## 2021-03-05 ENCOUNTER — Encounter: Payer: Self-pay | Admitting: *Deleted

## 2021-03-05 NOTE — Telephone Encounter (Signed)
Will not need any additional xrays prior to cardiac MRI. Please go aheada nd schedule. Thanks!

## 2021-03-05 NOTE — Telephone Encounter (Signed)
Attempted to return call to patients mother Janace Hoard (okay per DPR). Left message to return call to the office.

## 2021-03-06 ENCOUNTER — Inpatient Hospital Stay (HOSPITAL_COMMUNITY)
Admission: RE | Admit: 2021-03-06 | Payer: BC Managed Care – PPO | Source: Home / Self Care | Admitting: Thoracic Surgery (Cardiothoracic Vascular Surgery)

## 2021-03-06 ENCOUNTER — Telehealth: Payer: Self-pay | Admitting: Internal Medicine

## 2021-03-06 ENCOUNTER — Encounter (HOSPITAL_COMMUNITY): Admission: RE | Payer: Self-pay | Source: Home / Self Care

## 2021-03-06 SURGERY — EXCISION, MYXOMA, CARDIAC ATRIUM, MINIMALLY INVASIVE
Anesthesia: General | Site: Chest | Laterality: Right

## 2021-03-06 NOTE — Telephone Encounter (Signed)
Left message for patient to call and discuss scheduling the Cardiac MRI ordered by Dr. Margaretann Loveless

## 2021-03-07 ENCOUNTER — Telehealth: Payer: Self-pay | Admitting: Cardiology

## 2021-03-07 NOTE — Telephone Encounter (Signed)
Patient's mother states that she spoke with someone earlier regarding Mr. Romas's FMLA. I dont see any record of whom she was speaking with. She states she will be coming by the office in a little bit to drop off some paperwork but is requesting a call back. She believes the lady she spoke with was Maudie Mercury.

## 2021-03-07 NOTE — Telephone Encounter (Signed)
FMLA forms from [employer] dropped off 03/07/2021. Cash received and put in safe box. Completed pt auth attached. Forms put in [facility] box for completion. Completed forms to be picked up by pt and faxed to Blanca Friend [phone]. Charge sent to billing for posting. Copy of incomplete forms can be found at front desk and in pt demographics.  kbl

## 2021-03-07 NOTE — Telephone Encounter (Signed)
Left message for patient to call and discuss scheduling the Cardiac MRI ordered by Dr. Margaretann Loveless

## 2021-03-07 NOTE — Telephone Encounter (Signed)
Spoke to the patients mother just now and let her know that Dr. Loney Hering said he will fill out this paperwork for the patients past medical visits/procedures. She verbalizes understanding and states that she will bring this paperwork by the office today.    Encouraged patient to call back with any questions or concerns.

## 2021-03-11 ENCOUNTER — Encounter: Payer: Self-pay | Admitting: Internal Medicine

## 2021-03-12 NOTE — Telephone Encounter (Signed)
Attempted to call patients mother Janace Hoard (okay per DPR). Left message to return call to the office.

## 2021-03-13 NOTE — Telephone Encounter (Signed)
Spoke with patients mother who was returning phone call. Advised patients mother (okay per DPR) that per Dr. Margaretann Loveless no other Xray would be needed prior to the Cardiac MRI. Patient's mother states that patient is very large and is very claustrophobic and is demanding to know if the MRI machine used for Cardiac MRI's is large. Spoke with scheduler who called over to [facility] and per Zacarias Pontes the Cardiac MRI machine is large.   Advised patients mother of this. Patients mother demanding to know if this MRI machine is open. Advised patients mother I would forward message to Dr. Margaretann Loveless for review and advice. Patients mother verbalized understanding.

## 2021-03-13 NOTE — Telephone Encounter (Signed)
We reviewed the patient's TEE images at structural heart conference.  Concern was raised for a lesion in the atria, and further imaging requested by Dr. Roxy Manns.  After discussion, cardiac MRI felt to be the next best test, and we coordinated this on behalf of [facility].  If patient is unable to tolerate the MR bore due to size, would recommend further discussion with his primary cardiologist Dr. Bettina Gavia for alternate options for evaluation.   I have CCed Dr. Bettina Gavia as well as Marchia Bond nurse navigator for cardiac imaging, who may be able to provide additional assistance.

## 2021-03-14 NOTE — Telephone Encounter (Signed)
This was ordered by Dr. Roxy Manns after he had consultation with imagers any technical issues or questions about the test I would really direct back to them and not to me.

## 2021-03-14 NOTE — Telephone Encounter (Signed)
Spoke with MR and the cardiac MR machine has a bore diameter of 70cm. Table weight limit is 500lb technically but also based on patients body habitus and girth.   Another concern is when antianxiety meds are administered for claustrophobia, patients tend to nod off to sleep during the exam and this results in incompliant breath holding and breathing motion during the scan.  Daniel Vance

## 2021-03-18 ENCOUNTER — Other Ambulatory Visit: Payer: Self-pay

## 2021-03-18 ENCOUNTER — Ambulatory Visit
Admission: RE | Admit: 2021-03-18 | Discharge: 2021-03-18 | Disposition: A | Discharge status: Home / Self Care | Payer: BC Managed Care – PPO | Source: Ambulatory Visit | Attending: Thoracic Surgery (Cardiothoracic Vascular Surgery) | Admitting: Thoracic Surgery (Cardiothoracic Vascular Surgery)

## 2021-03-18 DIAGNOSIS — Z01818 Encounter for other preprocedural examination: Secondary | ICD-10-CM | POA: Diagnosis not present

## 2021-03-18 DIAGNOSIS — J90 Pleural effusion, not elsewhere classified: Secondary | ICD-10-CM | POA: Diagnosis not present

## 2021-03-18 DIAGNOSIS — N2889 Other specified disorders of kidney and ureter: Secondary | ICD-10-CM | POA: Diagnosis not present

## 2021-03-18 DIAGNOSIS — K828 Other specified diseases of gallbladder: Secondary | ICD-10-CM | POA: Diagnosis not present

## 2021-03-18 DIAGNOSIS — N281 Cyst of kidney, acquired: Secondary | ICD-10-CM | POA: Diagnosis not present

## 2021-03-18 MED ORDER — GADOBENATE DIMEGLUMINE 529 MG/ML IV SOLN
20.0000 mL | Freq: Once | INTRAVENOUS | Status: AC | PRN
  Administered 2021-03-18: 20 mL via INTRAVENOUS

## 2021-03-22 ENCOUNTER — Telehealth (HOSPITAL_COMMUNITY): Payer: Self-pay | Admitting: Emergency Medicine

## 2021-03-22 NOTE — Telephone Encounter (Signed)
Attempted to call patient regarding upcoming cardiac CT appointment. °Left message on voicemail with name and callback number °Verginia Toohey RN Navigator Cardiac Imaging °[facility] °[phone] Office °[phone] Cell ° °

## 2021-03-25 ENCOUNTER — Telehealth (HOSPITAL_COMMUNITY): Payer: Self-pay | Admitting: Emergency Medicine

## 2021-03-25 NOTE — Telephone Encounter (Signed)
Attempted to call patient regarding upcoming cardiac MR appointment. Left message on voicemail with name and callback number Baeleigh Devincent RN Navigator Cardiac Imaging [facility] [phone] Office [phone] Cell  

## 2021-03-26 ENCOUNTER — Other Ambulatory Visit: Payer: Self-pay

## 2021-03-26 ENCOUNTER — Ambulatory Visit (HOSPITAL_COMMUNITY)
Admission: RE | Admit: 2021-03-26 | Discharge: 2021-03-26 | Disposition: A | Discharge status: Home / Self Care | Payer: BC Managed Care – PPO | Source: Ambulatory Visit | Attending: Internal Medicine | Admitting: Internal Medicine

## 2021-03-26 DIAGNOSIS — I5189 Other ill-defined heart diseases: Secondary | ICD-10-CM | POA: Insufficient documentation

## 2021-03-26 MED ORDER — GADOBUTROL 1 MMOL/ML IV SOLN
10.0000 mL | Freq: Once | INTRAVENOUS | Status: AC | PRN
  Administered 2021-03-26: 10 mL via INTRAVENOUS

## 2021-03-28 ENCOUNTER — Telehealth: Payer: Self-pay | Admitting: Internal Medicine

## 2021-03-28 DIAGNOSIS — Z1331 Encounter for screening for depression: Secondary | ICD-10-CM | POA: Diagnosis not present

## 2021-03-28 DIAGNOSIS — Z6836 Body mass index (BMI) 36.0-36.9, adult: Secondary | ICD-10-CM | POA: Diagnosis not present

## 2021-03-28 DIAGNOSIS — I509 Heart failure, unspecified: Secondary | ICD-10-CM | POA: Diagnosis not present

## 2021-03-28 NOTE — Telephone Encounter (Signed)
Morbid this is awfully complex I am not sure what these episodes are he is not taking any other medications that cause vomiting Is there any way you can work him up with me next week I am just not sure what else to do I have not seen him since he went to [place] to be evaluated. If unable to be put in with me perhaps Dr. Sherren Mocha could see she is openings office I know that my schedule is presently with 2 weeks off for my son's wedding

## 2021-03-28 NOTE — Telephone Encounter (Signed)
Left message on patients voicemail to please return our call.   

## 2021-03-28 NOTE — Telephone Encounter (Signed)
Patient's mother states she is returning a call. May be regarding MRI results.

## 2021-03-28 NOTE — Telephone Encounter (Signed)
Spoke with mother and she is very concerned about patient.  Per mother patient has had 3 "spells" since end of March. First one end of March, last week, and yesterday During these spells he becomes short of breath with nausea and vomiting. Vomits until has dry heaves  Once episode is over feels better Per mother not associated with food and has no chest pain Instructed mother to have patient take it easy and keep appointment Monday Will forward to Dr Margaretann Loveless and Dr Steward Ros for further review

## 2021-03-28 NOTE — Telephone Encounter (Signed)
Hi Melinda, this is Dr. Joya Gaskins patient, I have not evaluated him before. Happy to help from a structural team standpoint as needed. Thanks, GA

## 2021-03-29 ENCOUNTER — Encounter: Payer: Self-pay | Admitting: Cardiology

## 2021-03-29 NOTE — Telephone Encounter (Signed)
Left message on patients voicemail to please return our call.   

## 2021-03-29 NOTE — Progress Notes (Signed)
His mother did phone the office about an episode of weakness nausea vomiting shortness of breath I told my office staff to work him in to see me. I went to the office tonight reviewed his record his MRI shows a dramatic decrease in function EF 18% I spoke with Dr. Haroldine Laws advanced heart failure he feels he is best served by being evaluated in the emergency room and probably admitted to [facility] he is on for the weekend and he can see him over the weekend I called the patient and his mother spoke to them on speaker phone he has had no further symptoms since Wednesday I asked him to either go to the emergency room this evening or tomorrow morning and I could not get a commitment from them that they would do it He has an appointment to be seen by Dr. Ricard Dillon CT surgery Monday with his aortic regurgitation previous ejection fraction was 40 to 45% and when I saw him at the beginning of March he was doing quite well with compensated heart failure and rate controlled atrial fibrillation there has been a profound decrease in cardiac function.

## 2021-04-01 ENCOUNTER — Ambulatory Visit: Payer: BC Managed Care – PPO | Admitting: Thoracic Surgery (Cardiothoracic Vascular Surgery)

## 2021-04-01 ENCOUNTER — Encounter: Payer: Self-pay | Admitting: Thoracic Surgery (Cardiothoracic Vascular Surgery)

## 2021-04-01 ENCOUNTER — Other Ambulatory Visit: Payer: Self-pay

## 2021-04-01 VITALS — BP 130/79 | HR 92 | Resp 20 | Ht 71.0 in | Wt 278.0 lb

## 2021-04-01 DIAGNOSIS — I480 Paroxysmal atrial fibrillation: Secondary | ICD-10-CM | POA: Diagnosis not present

## 2021-04-01 DIAGNOSIS — I351 Nonrheumatic aortic (valve) insufficiency: Secondary | ICD-10-CM | POA: Diagnosis not present

## 2021-04-01 DIAGNOSIS — I5189 Other ill-defined heart diseases: Secondary | ICD-10-CM

## 2021-04-01 DIAGNOSIS — I4819 Other persistent atrial fibrillation: Secondary | ICD-10-CM

## 2021-04-01 DIAGNOSIS — I5042 Chronic combined systolic (congestive) and diastolic (congestive) heart failure: Secondary | ICD-10-CM

## 2021-04-01 NOTE — Telephone Encounter (Signed)
Left message on patients voicemail to please return our call.   I will mail the patient a letter at this time as I have tried to reach him x3 with no success.

## 2021-04-01 NOTE — Progress Notes (Addendum)
[address]       [place] [address]             [phone]     CARDIOTHORACIC SURGERY OFFICE NOTE  Referring Provider is Richardo Priest, MD PCP is Angelina Sheriff, MD   HPI:  Patient is a 45 year old obese white male with history of longstanding tobacco abuse who was recently hospitalized at [facility] in [place] with newly discovered acute diastolic congestive heart failure and rapid atrial fibrillation and returns to the office today for follow-up of aortic insufficiency and possible soft tissue mass adherent to the mitral valve.  He was last seen here in our office on March 04, 2021 at which time we made plans to proceed with cardiac gated MRI of the heart to further characterize the severity of aortic insufficiency and whether or not the patient had a significant mass adherent to the mitral valve.  He also underwent abdominal MRI to evaluate small nodular lesion in the left kidney of unclear significance.  Patient returns to the office today for follow-up.  He states that over the last couple weeks he has had some good days and some bad days.  He had an episode where he was doing something fairly strenuous at work and he got very dizzy and short of breath.  He states that he also had some nausea and nearly vomited in association with the symptoms.  He thinks that his heart was racing at the time.  He has had some other similar episodes off and on periodically.  He states that yesterday and today he has been feeling better.  He otherwise describes stable symptoms of exertional shortness of breath.  He has not had resting shortness of breath or chest discomfort.   Current Outpatient Medications  Medication Sig Dispense Refill  . ELIQUIS 5 MG TABS tablet Take 1 tablet (5 mg total) by mouth 2 (two) times daily. 180 tablet 3  . ENTRESTO 24-26 MG Take 1 tablet by mouth 2 (two) times daily. 180 tablet 3  . metoprolol tartrate (LOPRESSOR) 25 MG tablet Take  0.5 tablets (12.5 mg total) by mouth 2 (two) times daily. 90 tablet 3  . nicotine (NICODERM CQ - DOSED IN MG/24 HOURS) 21 mg/24hr patch Place 21 mg onto the skin at bedtime.    Marland Kitchen omeprazole (PRILOSEC) 20 MG capsule Take 20 mg by mouth daily.    . Potassium Chloride ER 20 MEQ TBCR Take 20 mEq by mouth 2 (two) times daily. 180 tablet 3  . spironolactone (ALDACTONE) 25 MG tablet Take 1 tablet (25 mg total) by mouth daily. 90 tablet 3  . torsemide (DEMADEX) 20 MG tablet Take 1 tablet (20 mg total) by mouth daily. 90 tablet 3   No current facility-administered medications for this visit.      Physical Exam:   BP 130/79 (BP Location: Right Arm, Patient Position: Sitting)   Pulse 92   Resp 20   Ht 5\' 11"  (1.803 m)   Wt 278 lb (126.1 kg)   SpO2 94% Comment: RA  BMI 38.77 kg/m   General:  Obese male in no distress  Chest:   Clear to auscultation with symmetric breath sounds  CV:   Irregular rate and rhythm  Incisions:  n/a  Abdomen:  Soft nontender  Extremities:  Warm and well-perfused  Diagnostic Tests:  CARDIAC MRI  TECHNIQUE: The patient was scanned on a 1.5 Tesla Siemens magnet. A dedicated cardiac coil  was used. Functional imaging was done using Fiesta sequences. 2,3, and 4 chamber views were done to assess for RWMA's. Modified Simpson's rule using a short axis stack was used to calculate an ejection fraction on a dedicated work Conservation officer, nature. The patient received 10 cc of Gadavist. After 10 minutes inversion recovery sequences were used to assess for infiltration and scar tissue.  CONTRAST:  10 cc  of Gadavist  FINDINGS: Left ventricle:  -Severe dilatation  -Severe systolic dysfunction  -Mild nonspecific ECV elevation (29%)  -Basal septal midwall LGE.  Also basal lateral midwall LGE  -RV insertion site LGE  LV EF: 18% (Normal 56-78%)  Absolute volumes:  LV EDV: 47mL (Normal 77-195 mL)  LV ESV: 326mL (Normal 19-72 mL)  LV SV:  44mL (Normal 51-133 mL)  CO: 7.7L/min (Normal 2.8-8.8 L/min)  Indexed volumes:  LV EDV: 1104mL/sq-m (Normal 47-92 mL/sq-m)  LV ESV: 119mL/sq-m (Normal 13-30 mL/sq-m)  LV SV: 104mL/sq-m (Normal 32-62 mL/sq-m)  CI: L/min/sq-m (Normal 1.7-4.2 L/min/sq-m)  Right ventricle: Mild dilation, severe systolic dysfunction  RV EF:  22% (Normal 47-74%)  Absolute volumes:  RV EDV: 277mL (Normal 88-227 mL)  RV ESV: 278mL (Normal 23-103 mL)  RV SV: 61mL (Normal 52-138 mL)  CO: 5.8L/min (Normal 2.8-8.8 L/min)  Indexed volumes:  RV EDV: 153mL/sq-m (Normal 55-105 mL/sq-m)  RV ESV: 65mL/sq-m (Normal 15-43 mL/sq-m)  RV SV: 42mL/sq-m (Normal 32-64 mL/sq-m)  CI: 2.3L/min/sq-m (Normal 1.7-4.2 L/min/sq-m)  Left atrium: Moderate enlargement  Right atrium: Moderate enlargement  Mitral valve: No regurgitation  Aortic valve: Severe regurgitation (regurgitant fraction 53%)  Tricuspid valve: No regurgitation  Pulmonic valve: No regurgitation  Aorta: Normal proximal ascending aorta  Pericardium: Moderate effusion  IMPRESSION: 1.  No cardiac mass seen  2.  Severe aortic regurgitation (regurgitant fraction 53%)  3.  Severe LV dilatation with severe systolic dysfunction (EF 17%)  4.  Mild RV dilatation with severe systolic dysfunction  5. Basal septal midwall LGE, which is a scar pattern seen in nonischemic cardiomyopathies and associated with worse prognosis. Also with midwall LGE in basal lateral wall  6. RV insertion site LGE, which is a nonspecific scar pattern often seen in setting of elevated pulmonary pressures  7. Moderate pericardial effusion, measuring up to 1cm adjacent to LV inferior wall   Electronically Signed   By: Oswaldo Milian MD   On: 03/26/2021 22:59    MRI ABDOMEN WITHOUT AND WITH CONTRAST  TECHNIQUE: Multiplanar multisequence MR imaging of the abdomen was performed both before and after the administration  of intravenous contrast.  CONTRAST:  60mL MULTIHANCE GADOBENATE DIMEGLUMINE 529 MG/ML IV SOLN  COMPARISON:  02/25/2021  FINDINGS: Lower chest: Small bilateral pleural effusions.  Hepatobiliary: No suspicious liver abnormality identified. The gallbladder wall appears mildly edematous measuring 5 mm in maximum thickness. No gallstones identified. No bile duct dilatation.  Pancreas: No mass, inflammatory changes, or other parenchymal abnormality identified.  Spleen:  Within normal limits in size and appearance.  Adrenals/Urinary Tract: Normal appearance of the adrenal glands. Arising off the lateral cortex of the left inferior pole is a 1 cm enhancing lesion, image 77/5. This is also seen on coronal image 30/17. On the T2 weighted sequences there is central increased T2 signal with a peripheral rim of decreased T2 signal. On the T1 weighted, pre contrast images this is mildly hypointense.  Hemorrhagic cyst arising off the anterior cortex of the inferior pole of left kidney measures 0.9 cm, image 79/10. Right kidney is unremarkable.  Stomach/Bowel: Visualized  portions within the abdomen are unremarkable.  Vascular/Lymphatic: No pathologically enlarged lymph nodes identified. No abdominal aortic aneurysm demonstrated.  Other: There is a small amount of fluid identified within the right upper quadrant of the abdomen within the gallbladder fossa and porta hepatic region, nonspecific.  Musculoskeletal: No suspicious bone lesions identified.  IMPRESSION: The a  1. 1 cm enhancing lesion arises off the lateral cortex of the inferior pole of left kidney. Cannot exclude small renal cell carcinoma. Urologic consultation is advised. 2. 0.9 cm hemorrhagic cyst arises off the anterior cortex of the inferior pole of left kidney. 3. Mild gallbladder wall thickening along with trace fluid in the gallbladder fossa and porta hepatic region. If there are any clinical signs  or symptoms of cholecystitis consider further investigation with right upper quadrant sonogram.   Electronically Signed   By: Kerby Moors M.D.   On: 03/18/2021 12:27    Impression:  Patient has stage D1 severe symptomatic aortic insufficiency.  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  He had been doing reasonably well on medical therapy, although over the past 2 weeks she has had episodes of increased shortness of breath, palpitations, and nausea.  He currently states that he feels well although on exam his pulse feels irregular with reasonably well-controlled heart rate.  I am suspicious that he may be back in atrial fibrillation.  I have personally reviewed the patient's recent cardiac MRI which confirms the presence of severe aortic insufficiency with severe left ventricular chamber enlargement and severe left ventricular systolic dysfunction.  There does not appear to be a significant mass involving the mitral valve as previously suspected.  Abdominal MRI also reveals small peripheral mass in the left kidney of unclear significance for which the patient will ultimately need referral and follow-up with the urologist.    Plan:  The patient and his mother counseled at length regarding treatment alternatives for management of severe aortic insufficiency including continued medical therapy versus proceeding with aortic valve replacement in the near future.  The natural history of aortic insufficiency was reviewed, as was long term prognosis with medical therapy alone.  Surgical options were discussed at length including conventional surgical aortic valve replacement through either a full median sternotomy or using minimally invasive techniques.  Other alternatives including aortic valve repair, the Ross  autograft procedure, and homograft aortic root replacement were discussed.  Given the severity of the patient's left ventricular dysfunction I would not consider him a candidate for minimally invasive valve replacement nor an attempt at valve repair under any circumstances.  In fact, I am concerned that he might require prolonged pharmacologic and/or mechanical circulatory support.  Discussion was held comparing the relative risks of mechanical valve replacement with need for lifelong anticoagulation versus use of a bioprosthetic tissue valve and the associated potential for late structural valve deterioration and failure.  The relative risks and benefits of performing a maze procedure at the time of his surgery was discussed at length, including the expected likelihood of long term freedom from recurrent symptomatic atrial fibrillation and/or atrial flutter.  We also discussed the nodular lesion seen on the left kidney on recent abdominal MRI.  The patient understands that he will need follow-up with the urologist once his cardiac issues have been definitively managed.  As far as the timing of surgical intervention is concerned,  I am concerned by the severity of the patient's left ventricular systolic dysfunction and continued symptoms.  It is unclear whether or not he is going in and out of atrial fibrillation.  He did not have right heart catheterization performed nor was the severity of aortic insufficiency interrogated at the time of his previous diagnostic cardiac catheterization.  Under the circumstances, I think would be best for the patient to be evaluated to soon as possible in the advanced heart failure clinic.  It is conceivable that he might need hospital admission for right heart catheterization and medical tune up before making plans for definitive surgical intervention.  We will make arrangements for him to be evaluated as soon as possible.  During the interim I have instructed the patient that he  should stay out of work until further notice.  He may need to remain out of work until after he has completely recovered from aortic valve replacement.    I spent in excess of 30 minutes during the conduct of this office consultation and >50% of this time involved direct face-to-face encounter with the patient for counseling and/or coordination of their care.    Valentina Gu. Roxy Manns, MD 04/01/2021 4:04 PM

## 2021-04-02 ENCOUNTER — Inpatient Hospital Stay (HOSPITAL_BASED_OUTPATIENT_CLINIC_OR_DEPARTMENT_OTHER)
Admission: RE | Admit: 2021-04-02 | Payer: BC Managed Care – PPO | Source: Ambulatory Visit | Admitting: Internal Medicine

## 2021-04-02 DIAGNOSIS — I351 Nonrheumatic aortic (valve) insufficiency: Secondary | ICD-10-CM

## 2021-04-02 NOTE — Progress Notes (Signed)
Patient did not show for appt    ADVANCED HF CLINIC CONSULT NOTE  Referring Physician: Drs. 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  He was last seen here in our office on March 04, 2021 at which time we made plans to proceed with cardiac gated MRI of the heart to further characterize the severity of aortic insufficiency and whether or not the patient had a significant mass adherent to the mitral valve.  He also underwent abdominal MRI to evaluate small nodular lesion in the left kidney of unclear significance.   Patient has relatively small soft tissue lesion incorporated within the posterior mitral annulus and/or posterior mitral valve leaflet of unclear significance.  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  It does not look anything like an atrial myxoma, and it may be myxomatous disease within the mitral valve complex itself.  The patient does have at least moderate aortic insufficiency, and he recently presented with new onset persistent atrial fibrillation and diastolic congestive heart failure.  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  Recent diagnostic cardiac catheterization revealed normal coronary arteries with no significant coronary artery disease although there was severely elevated left ventricular end-diastolic pressure.  In addition, CTA of the chest, abdomen, and pelvis revealed a  small incidental finding involving a peripheral lesion in the left kidney of unclear significance.  The patient's previous pleural effusions and reactive lymphadenopathy have resolved.    1.  No cardiac mass seen   2.  Severe aortic regurgitation (regurgitant fraction 53%)   3.  Severe LV dilatation with severe systolic dysfunction (EF 09%)   4.  Mild RV dilatation with severe systolic dysfunction   5. Basal septal midwall LGE, which is a scar pattern seen in nonischemic cardiomyopathies and associated with worse prognosis. Also with midwall LGE in basal lateral wall   6. RV insertion site LGE, which is a nonspecific scar pattern often seen in setting of elevated pulmonary pressures   7. Moderate pericardial effusion, measuring up to 1cm adjacent to LV inferior wall   Patient returns to the office today for follow-up.  He states that over the last couple weeks he has had some good days and some bad days.  He had an episode where he was doing something fairly strenuous at work and he got very dizzy and short of breath.  He states that he also had some nausea and nearly vomited in association with the symptoms.  He thinks that his heart was racing at the time.  He has had some other similar episodes off and on periodically.  He states that yesterday and today he has been feeling better.  He otherwise describes stable symptoms of exertional shortness of breath.  He has not had resting shortness of breath or chest discomfort.       Review of Systems: [y] = yes, [ ]  = no   General: Weight gain [ ] ; Weight loss [ ] ; Anorexia [ ] ; Fatigue [ ] ; Fever [ ] ; Chills [ ] ;  Weakness [ ]   Cardiac: Chest pain/pressure [ ] ; Resting SOB [ ] ; Exertional SOB [ ] ; Orthopnea [ ] ; Pedal Edema [ ] ; Palpitations [ ] ; Syncope [ ] ; Presyncope [ ] ; Paroxysmal nocturnal dyspnea[ ]   Pulmonary: Cough [ ] ; Wheezing[ ] ; Hemoptysis[ ] ; Sputum [ ] ; Snoring [ ]   GI: Vomiting[ ] ; Dysphagia[ ] ; Melena[ ] ; Hematochezia [ ] ;  Heartburn[ ] ; Abdominal pain [ ] ; Constipation [ ] ; Diarrhea [ ] ; BRBPR [ ]   GU: Hematuria[ ] ; Dysuria [ ] ; Nocturia[ ]   Vascular: Pain in legs with walking [ ] ; Pain in feet with lying flat [ ] ; Non-healing sores [ ] ; Stroke [ ] ; TIA [ ] ; Slurred speech [ ] ;  Neuro: Headaches[ ] ; Vertigo[ ] ; Seizures[ ] ; Paresthesias[ ] ;Blurred vision [ ] ; Diplopia [ ] ; Vision changes [ ]   Ortho/Skin: Arthritis [ ] ; Joint pain [ ] ; Muscle pain [ ] ; Joint swelling [ ] ; Back Pain [ ] ; Rash [ ]   Psych: Depression[ ] ; Anxiety[ ]   Heme: Bleeding problems [ ] ; Clotting disorders [ ] ; Anemia [ ]   Endocrine: Diabetes [ ] ; Thyroid dysfunction[ ]    Past Medical History:  Diagnosis Date   Asthma    As a child   Atrial fibrillation (HCC)    Heart failure (HCC)    Hypertension    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 180 tablet 3   ENTRESTO 24-26 MG Take 1 tablet by mouth 2 (two) times daily. 180 tablet 3   metoprolol tartrate (LOPRESSOR) 25 MG tablet Take 0.5 tablets (12.5 mg total) by mouth 2 (two) times daily. 90 tablet 3   nicotine (NICODERM CQ - DOSED IN MG/24 HOURS) 21 mg/24hr patch Place 21 mg onto the skin at bedtime.     omeprazole (PRILOSEC) 20 MG capsule Take 20 mg by mouth daily.     Potassium Chloride ER 20 MEQ TBCR Take 20 mEq by mouth 2 (two) times daily. 180 tablet 3   spironolactone (ALDACTONE) 25 MG tablet Take 1 tablet (25 mg total) by mouth daily. 90 tablet 3   torsemide (DEMADEX) 20 MG tablet Take 1 tablet (20 mg total) by mouth daily. 90 tablet 3   No current facility-administered medications for this encounter.    No Known Allergies    Social History   Socioeconomic History   Marital status: Single    Spouse name: Not on file   Number of children: Not on file   Years of education: Not on file   Highest education level: Not on  file  Occupational History   Not on file  Tobacco Use   Smoking status: Former Smoker    Packs/day: 1.00    Years: 25.00    Pack years: 25.00    Types: Cigarettes    Quit date: 01/14/2021    Years since quitting: 0.2   Smokeless tobacco: Never Used  Vaping Use   Vaping Use: Never used  Substance and Sexual Activity   Alcohol use: Yes    Comment: 8-10 Beers nightly   Drug use: Not Currently    Types: Marijuana   Sexual activity: Not on file  Other Topics Concern   Not on file  Social History Narrative   Not on file   Social Determinants of Health   Financial Resource Strain: Not on file  Food  Insecurity: Not on file  Transportation Needs: Not on file  Physical Activity: Not on file  Stress: Not on file  Social Connections: Not on file  Intimate Partner Violence: Not on file      Family History  Problem Relation Age of Onset   Stomach cancer Father    Hypertension Father    Heart attack Father    Heart failure Father     There were no vitals filed for this visit.  PHYSICAL EXAM: General:  Well appearing. No respiratory difficulty HEENT: normal Neck: supple. no JVD. Carotids 2+ bilat; no bruits. No lymphadenopathy or thryomegaly appreciated. Cor: PMI nondisplaced. Regular rate & rhythm. No rubs, gallops or murmurs. Lungs: clear Abdomen: soft, nontender, nondistended. No hepatosplenomegaly. No bruits or masses. Good bowel sounds. Extremities: no cyanosis, clubbing, rash, edema Neuro: alert & oriented x 3, cranial nerves grossly intact. moves all 4 extremities w/o difficulty. Affect pleasant.  ECG:   ASSESSMENT & PLAN:

## 2021-04-03 ENCOUNTER — Telehealth (HOSPITAL_COMMUNITY): Payer: Self-pay | Admitting: *Deleted

## 2021-04-03 ENCOUNTER — Inpatient Hospital Stay (HOSPITAL_COMMUNITY)
Admission: RE | Admit: 2021-04-03 | Payer: BC Managed Care – PPO | Source: Ambulatory Visit | Admitting: Internal Medicine

## 2021-04-14 NOTE — Telephone Encounter (Signed)
Pt was sch to see Dr Haroldine Laws 04/02/21 and called office to cx, appt was rescheduled to today at 2:30. Pt did not show up for this appointment today, I called pt to resch and left VM on his mobile number, called home number and spoke w/his mother who states he "passed away at 3:30 this AM in her home". She is very upset, offered condolences and she hung up on me.

## 2021-04-14 DEATH — deceased

## 2022-04-01 IMAGING — CT CT ANGIO CHEST
2 of 6 series · 15 of 46 positions shown · IV contrast (OMNIPAQUE)
Comparison: CTA chest 01/21/2021

CLINICAL DATA: 44-year-old with left atrial mass and preoperative
evaluation.

EXAM:
CT ANGIOGRAPHY CHEST, ABDOMEN AND PELVIS
TECHNIQUE: Non-contrast CT of the chest was initially obtained.

[Series 4: axial arterial · axial · arterial · 0.86mm/px · z∈[+951,+1575]mm · 12 of 235 slices shown]
[im 14/235  lung]
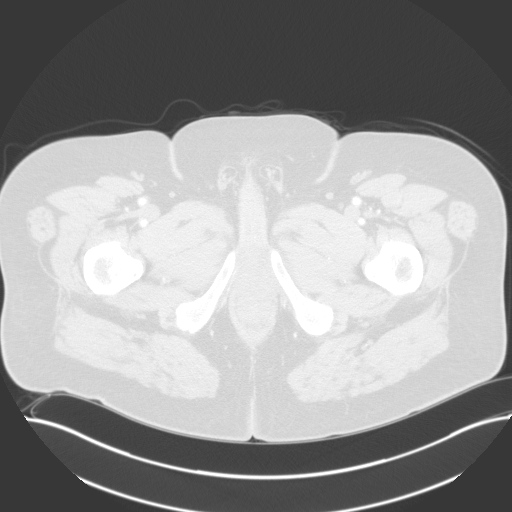
[im 40/235  soft-tissue]
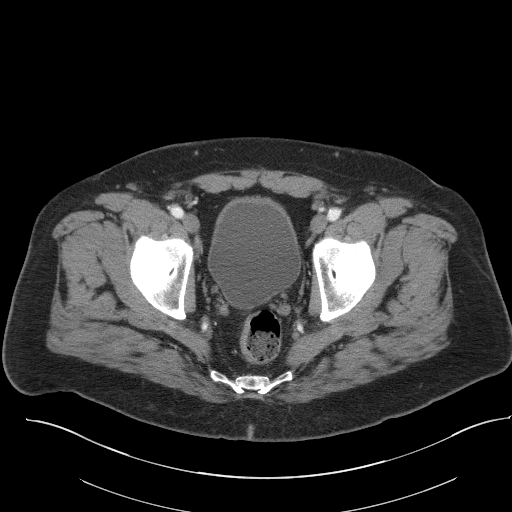
[im 53/235  lung]
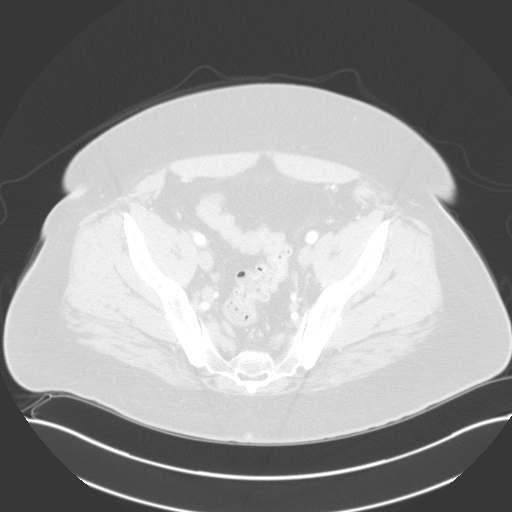
[im 66/235  soft-tissue]
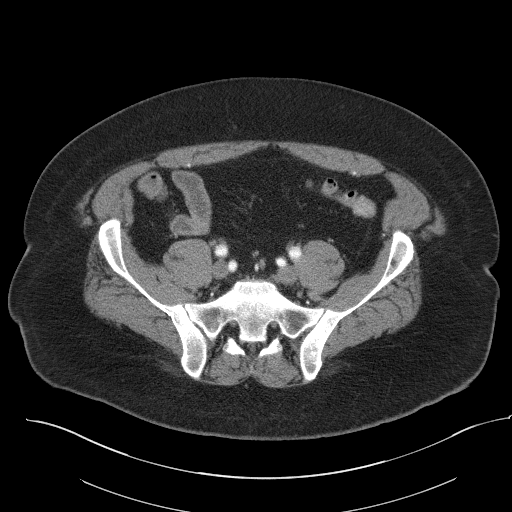
[im 92/235  lung]
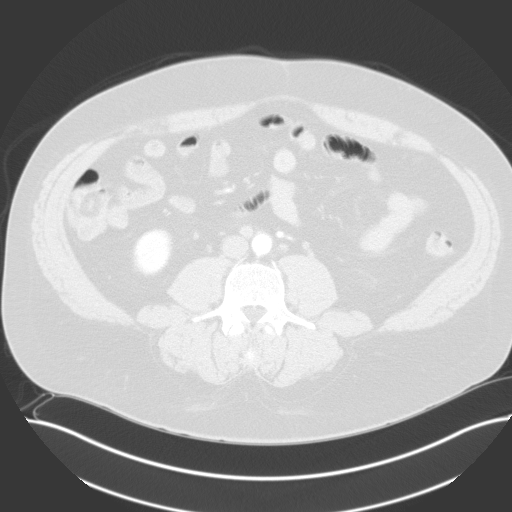
[im 105/235  soft-tissue]
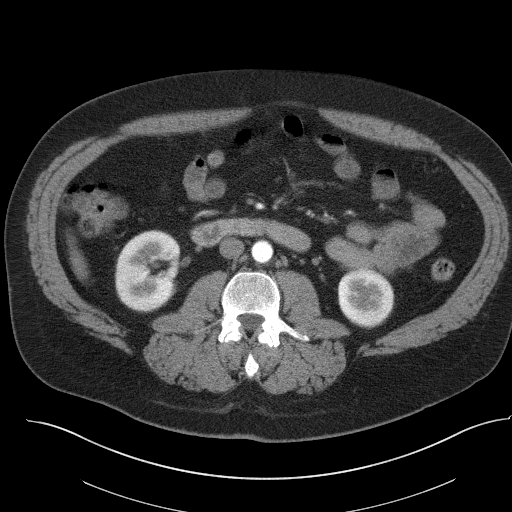
[im 131/235  lung]
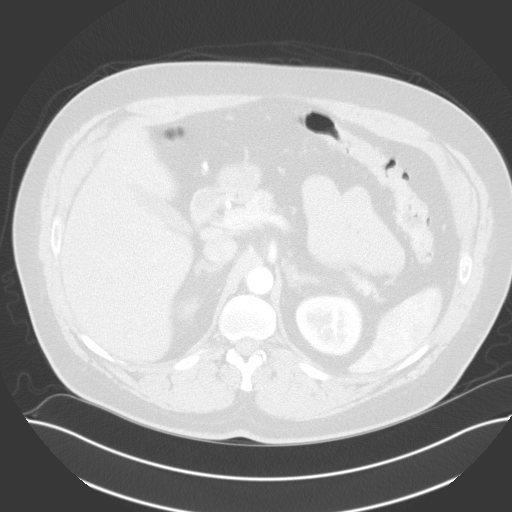
[im 144/235  soft-tissue]
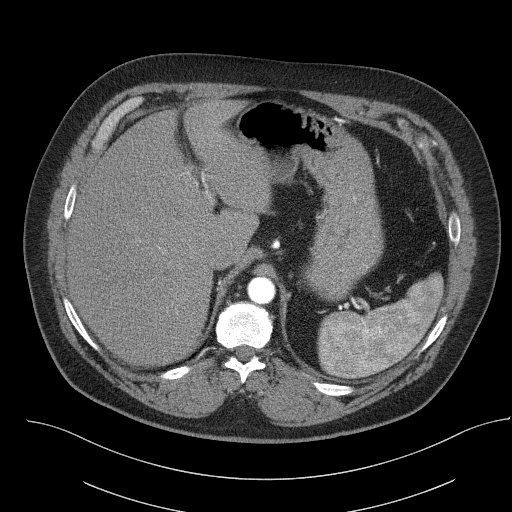
[im 170/235  lung]
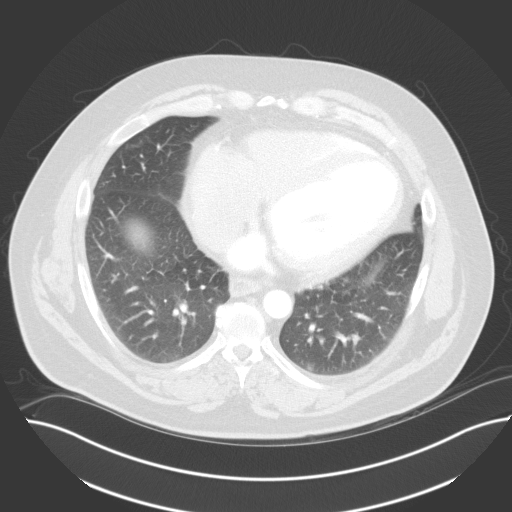
[im 183/235  soft-tissue]
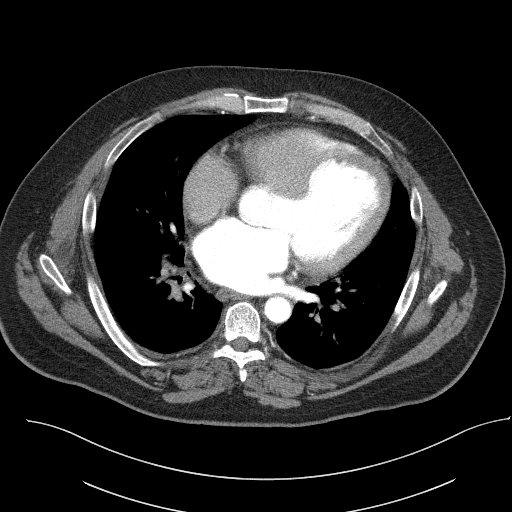
[im 196/235  lung]
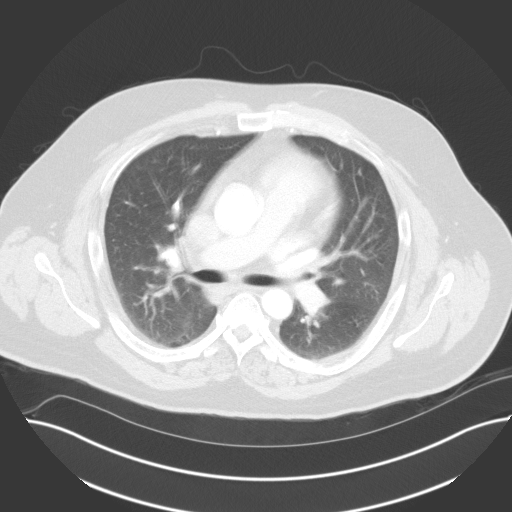
[im 222/235  soft-tissue]
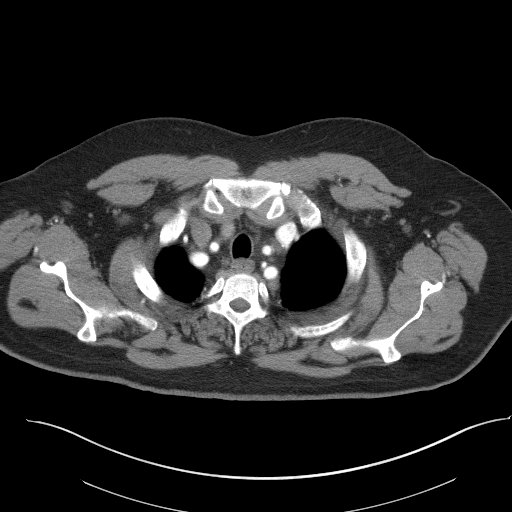

[Series 5: coronal · coronal · 1.44mm/px · 3 of 95 slices shown]
[im 24/95  soft-tissue]
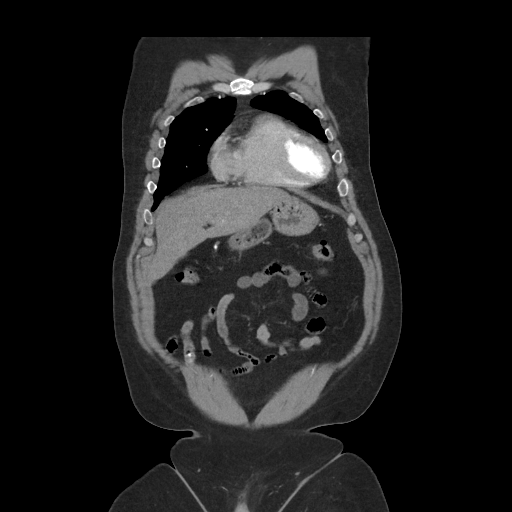
[im 48/95  soft-tissue]
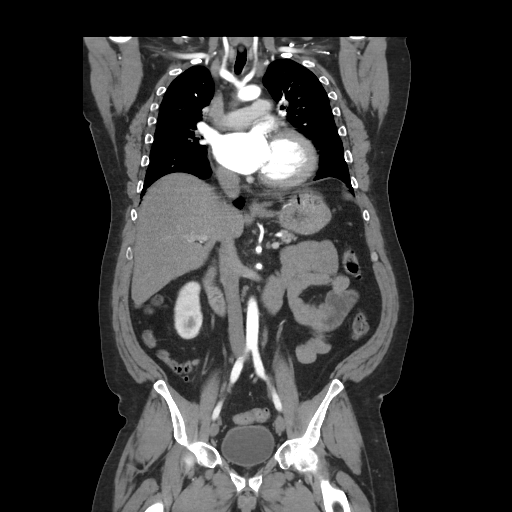
[im 71/95  soft-tissue]
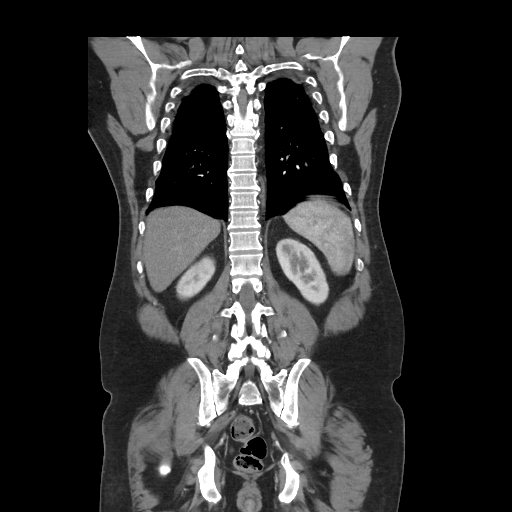

[15 of 46 positions shown; findings below may reference images not displayed]

Multidetector CT imaging through the chest, abdomen and pelvis was
performed using the standard protocol during bolus administration of
intravenous contrast. Multiplanar reconstructed images and MIPs were
obtained and reviewed to evaluate the vascular anatomy.

CONTRAST:  100mL OMNIPAQUE IOHEXOL 350 MG/ML SOLN
FINDINGS: CTA CHEST FINDINGS

Cardiovascular: Right side of the heart is poorly characterized due
to the phase of contrast. Contrast is predominantly in the left
heart and aorta. There is significant motion artifact involving the
ascending thoracic aorta and limited evaluation for dissection. No
evidence for thoracic aortic aneurysm. Variant arch anatomy with a
common trunk for the innominate artery and left common carotid
artery. Left vertebral artery originates directly from the aortic
arch and proximal to the left subclavian artery. Great vessels are
widely patent. No significant atherosclerotic disease in the
thoracic aorta or great vessels. Heart is enlarged. No significant
pericardial effusion.Internal mammary arteries are patent
bilaterally.

Mediastinum/Nodes: Again noted are small mediastinal lymph nodes.
Prevascular lymph node on sequence 4, image 20 measures 5 mm in the
short axis and previously measured 11 mm. Overall, these lymph nodes
have decreased in size compared to the exam from 01/21/2021. Small
amount of soft tissue in the right infrahilar region on sequence 4,
image 49 measures 12 mm in the short axis but this area is difficult
to compare due to pleural fluid on the previous examination. Right
hilar tissue on sequence 4, 52 measures roughly 8 mm in the short
axis and previously measured 10 mm. Esophagus is unremarkable. No
significant axillary lymph node enlargement.

Lungs/Pleura: Trachea and mainstem bronchi are patent. Pleural
effusions have resolved from the prior examination. Significant
motion artifact throughout the lungs but there is no significant
airspace disease or lung consolidation.

Musculoskeletal: Bone structures are unremarkable.

Review of the MIP images confirms the above findings.

CTA ABDOMEN AND PELVIS FINDINGS

VASCULAR

Aorta: Normal caliber aorta without aneurysm, dissection, vasculitis
or significant stenosis.

Celiac: Patent without evidence of aneurysm, dissection, vasculitis
or significant stenosis.

SMA: Patent without evidence of aneurysm, dissection, vasculitis or
significant stenosis.

Renals: Both renal arteries are patent without evidence of aneurysm,
dissection or significant stenosis.

IMA: Patent without evidence of aneurysm, dissection, vasculitis or
significant stenosis.

Inflow: Small amount of wall calcifications involving the right
common iliac artery without stenosis. Bilateral common, external and
internal iliac arteries are widely patent without aneurysm or
dissection. Minimal atherosclerotic calcifications in the left
internal iliac artery. Proximal femoral arteries are patent.

Veins: No obvious venous abnormality within the limitations of this
arterial phase study.

Review of the MIP images confirms the above findings.

NON-VASCULAR

Hepatobiliary: Normal appearance of the liver and gallbladder. No
significant biliary dilatation.

Pancreas: Unremarkable. No pancreatic ductal dilatation or
surrounding inflammatory changes.

Spleen: Normal in size without focal abnormality.

Adrenals/Urinary Tract: Normal appearance of the adrenal glands.
Normal appearance of the right kidney without hydronephrosis. This
concern for a small peripheral lesion in the mid/lower left kidney
on sequence 4, image 124. This area roughly measures up to 11 mm on
the coronal formats, sequence 5 image 64. This could represent a
small enhancing lesion. There is a small hypodensity in the left
kidney lower pole that probably represents a cyst. Negative for left
hydronephrosis. Mild wall thickening involving the anterior and
superior aspect of the urinary bladder of uncertain etiology.

Stomach/Bowel: Stomach is within normal limits. Appendix appears
normal. No evidence of bowel wall thickening, distention, or
inflammatory changes.

Lymphatic: Mildly prominent periaortic lymph nodes. Index lymph node
between the aorta and IVC on sequence 4, image 144 measures 9 mm in
the short axis.

Reproductive: Prostate is unremarkable.

Other: Negative for ascites. Evidence for a tiny umbilical hernia
containing fat.

Musculoskeletal: No acute bone abnormality.

Review of the MIP images confirms the above findings.
IMPRESSION: 1. No significant atherosclerotic disease involving the thoracic or
abdominal aorta. Visceral arteries are widely patent. Minimal
atherosclerotic disease in the iliac arteries.
2. Variant aortic arch anatomy as described.
3. **An incidental finding of potential clinical significance has
been found. Concern for a small peripheral lesion in the left kidney
measuring up to 11 mm. A small enhancing renal neoplasm cannot be
excluded. Recommend further characterization with a dedicated renal
MRI, with and without contrast.**
4. Asymmetric wall thickening in the urinary bladder. Consider
urology consultation.
5. Cardiomegaly.
6. Bilateral pleural effusions have resolved since 01/21/2021.
[DATE]. Mild chest lymphadenopathy but this has decreased since
01/21/2021 and may be reactive.

These results will be called to the ordering clinician or
representative by the Radiologist Assistant, and communication
documented in the PACS or [REDACTED].
